# Patient Record
Sex: Male | Born: 1970 | Race: White | Hispanic: No | Marital: Married | Smoking: Never smoker
Health system: Southern US, Community
[De-identification: ages and names within clinical notes are randomized; demographics above are authoritative.]

## PROBLEM LIST (undated history)

## (undated) DIAGNOSIS — N200 Calculus of kidney: Secondary | ICD-10-CM

## (undated) HISTORY — PX: WISDOM TOOTH EXTRACTION: SHX21

---

## 1994-06-19 HISTORY — PX: WISDOM TOOTH EXTRACTION: SHX21

## 2015-05-29 ENCOUNTER — Emergency Department (HOSPITAL_BASED_OUTPATIENT_CLINIC_OR_DEPARTMENT_OTHER): Payer: 59

## 2015-05-29 ENCOUNTER — Encounter (HOSPITAL_BASED_OUTPATIENT_CLINIC_OR_DEPARTMENT_OTHER): Payer: Self-pay | Admitting: *Deleted

## 2015-05-29 ENCOUNTER — Emergency Department (HOSPITAL_BASED_OUTPATIENT_CLINIC_OR_DEPARTMENT_OTHER)
Admission: EM | Admit: 2015-05-29 | Discharge: 2015-05-29 | Disposition: A | Discharge status: Home / Self Care | Payer: 59 | Attending: Emergency Medicine | Admitting: Emergency Medicine

## 2015-05-29 DIAGNOSIS — R7989 Other specified abnormal findings of blood chemistry: Secondary | ICD-10-CM | POA: Diagnosis not present

## 2015-05-29 DIAGNOSIS — R109 Unspecified abdominal pain: Secondary | ICD-10-CM | POA: Diagnosis present

## 2015-05-29 DIAGNOSIS — N201 Calculus of ureter: Secondary | ICD-10-CM | POA: Diagnosis not present

## 2015-05-29 LAB — URINALYSIS, ROUTINE W REFLEX MICROSCOPIC
BILIRUBIN URINE: NEGATIVE
Glucose, UA: NEGATIVE mg/dL
Hgb urine dipstick: NEGATIVE
Ketones, ur: NEGATIVE mg/dL
LEUKOCYTES UA: NEGATIVE
NITRITE: NEGATIVE
PH: 7 (ref 5.0–8.0)
Protein, ur: NEGATIVE mg/dL
SPECIFIC GRAVITY, URINE: 1.025 (ref 1.005–1.030)

## 2015-05-29 LAB — CBC WITH DIFFERENTIAL/PLATELET
BASOS PCT: 0 %
Basophils Absolute: 0 10*3/uL (ref 0.0–0.1)
EOS ABS: 0 10*3/uL (ref 0.0–0.7)
Eosinophils Relative: 0 %
HEMATOCRIT: 43 % (ref 39.0–52.0)
HEMOGLOBIN: 14.7 g/dL (ref 13.0–17.0)
LYMPHS ABS: 1.2 10*3/uL (ref 0.7–4.0)
Lymphocytes Relative: 10 %
MCH: 29.2 pg (ref 26.0–34.0)
MCHC: 34.2 g/dL (ref 30.0–36.0)
MCV: 85.3 fL (ref 78.0–100.0)
Monocytes Absolute: 0.6 10*3/uL (ref 0.1–1.0)
Monocytes Relative: 5 %
NEUTROS ABS: 10 10*3/uL — AB (ref 1.7–7.7)
NEUTROS PCT: 85 %
Platelets: 272 10*3/uL (ref 150–400)
RBC: 5.04 MIL/uL (ref 4.22–5.81)
RDW: 12.8 % (ref 11.5–15.5)
WBC: 11.9 10*3/uL — AB (ref 4.0–10.5)

## 2015-05-29 LAB — BASIC METABOLIC PANEL
ANION GAP: 10 (ref 5–15)
BUN: 22 mg/dL — ABNORMAL HIGH (ref 6–20)
CALCIUM: 9.5 mg/dL (ref 8.9–10.3)
CHLORIDE: 106 mmol/L (ref 101–111)
CO2: 24 mmol/L (ref 22–32)
CREATININE: 1.57 mg/dL — AB (ref 0.61–1.24)
GFR calc non Af Amer: 52 mL/min — ABNORMAL LOW (ref >60)
GFR, EST AFRICAN AMERICAN: 60 mL/min — AB (ref >60)
Glucose, Bld: 112 mg/dL — ABNORMAL HIGH (ref 65–99)
Potassium: 4.4 mmol/L (ref 3.5–5.1)
SODIUM: 140 mmol/L (ref 135–145)

## 2015-05-29 MED ORDER — TAMSULOSIN HCL 0.4 MG PO CAPS: 0.4000 mg | ORAL_CAPSULE | Freq: Every day | ORAL | Status: DC

## 2015-05-29 MED ORDER — MORPHINE SULFATE (PF) 4 MG/ML IV SOLN
8.0000 mg | Freq: Once | INTRAVENOUS | Status: AC
  Administered 2015-05-29: 8 mg via INTRAVENOUS
  Filled 2015-05-29: qty 2

## 2015-05-29 MED ORDER — OXYCODONE-ACETAMINOPHEN 5-325 MG PO TABS: 1.0000 | ORAL_TABLET | ORAL | Status: DC | PRN

## 2015-05-29 MED ORDER — ONDANSETRON 4 MG PO TBDP: ORAL_TABLET | ORAL | Status: DC

## 2015-05-29 MED ORDER — ONDANSETRON HCL 4 MG/2ML IJ SOLN
4.0000 mg | Freq: Once | INTRAMUSCULAR | Status: AC
  Administered 2015-05-29: 4 mg via INTRAVENOUS
  Filled 2015-05-29: qty 2

## 2015-05-29 MED ORDER — SODIUM CHLORIDE 0.9 % IV BOLUS (SEPSIS)
1000.0000 mL | Freq: Once | INTRAVENOUS | Status: AC
  Administered 2015-05-29: 1000 mL via INTRAVENOUS

## 2015-05-29 NOTE — ED Notes (Signed)
Left flank pain since 0400- sudden onset- pain intermittent

## 2015-05-29 NOTE — Discharge Instructions (Signed)
Your Creatinine (a measure of your kidney function) is 1.57. This is elevated but there is no old one to compare to. Please drink plenty of fluids and have it rechecked with your primary doctor or the urologist in 1-2 weeks. If you develop fevers, intractable vomiting, worse pain or pain with urination.

## 2015-05-29 NOTE — ED Provider Notes (Signed)
CSN: [id number]     Arrival date & time 05/29/15  1100 History   First MD Initiated Contact with Patient 05/29/15 1201     Chief Complaint  Patient presents with  . Flank Pain     (Consider location/radiation/quality/duration/timing/severity/associated sxs/prior Treatment) HPI  44 year old male presents with sudden onset of left flank pain at 4 AM. Woke him up out of sleep and was hard to find a comfortable position. Took 2 ibuprofen. Did have multiple episodes of vomiting. Eventually able to go back to sleep and when he woke up this morning he has had continued intermittent pain. The pain now feels like it's more in his left abdomen and seems to radiate into his left scrotum. Denies dysuria or hematuria. No fevers. Has continued to have vomiting and vomited once upon arrival to the ER. No prior history of kidney stones or pain similar to this. Rates his pain currently as a 6/10.  History reviewed. No pertinent past medical history. Past Surgical History  Procedure Laterality Date  . Wisdom tooth extraction     No family history on file. Social History  Substance Use Topics  . Smoking status: Never Smoker   . Smokeless tobacco: Never Used  . Alcohol Use: No    Review of Systems  Constitutional: Negative for fever.  Gastrointestinal: Positive for nausea, vomiting and abdominal pain.  Genitourinary: Positive for flank pain. Negative for dysuria, hematuria and scrotal swelling.  All other systems reviewed and are negative.     Allergies  Review of patient's allergies indicates no known allergies.  Home Medications   Prior to Admission medications   Not on File   BP 146/82 mmHg  Pulse 58  Temp(Src) 98.7 F (37.1 C) (Oral)  Resp 20  Ht 5\' 10"  (1.778 m)  Wt 240 lb (108.863 kg)  BMI 34.44 kg/m2  SpO2 100% Physical Exam  Constitutional: He is oriented to person, place, and time. He appears well-developed and well-nourished.  HENT:  Head: Normocephalic and atraumatic.   Right Ear: External ear normal.  Left Ear: External ear normal.  Nose: Nose normal.  Eyes: Right eye exhibits no discharge. Left eye exhibits no discharge.  Neck: Neck supple.  Cardiovascular: Normal rate, regular rhythm, normal heart sounds and intact distal pulses.   Pulmonary/Chest: Effort normal and breath sounds normal.  Abdominal: Soft. He exhibits no distension. There is no tenderness. There is no CVA tenderness. Hernia confirmed negative in the right inguinal area and confirmed negative in the left inguinal area.  Genitourinary: Testes normal and penis normal. Right testis shows no swelling and no tenderness. Left testis shows no swelling and no tenderness.  Musculoskeletal: He exhibits no edema.  Neurological: He is alert and oriented to person, place, and time.  Skin: Skin is warm and dry.  Nursing note and vitals reviewed.   ED Course  Procedures (including critical care time) Labs Review Labs Reviewed  BASIC METABOLIC PANEL - Abnormal; Notable for the following:    Glucose, Bld 112 (*)    BUN 22 (*)    Creatinine, Ser 1.57 (*)    GFR calc non Af Amer 52 (*)    GFR calc Af Amer 60 (*)    All other components within normal limits  CBC WITH DIFFERENTIAL/PLATELET - Abnormal; Notable for the following:    WBC 11.9 (*)    Neutro Abs 10.0 (*)    All other components within normal limits  URINALYSIS, ROUTINE W REFLEX MICROSCOPIC (NOT AT [facility])  Imaging Review Ct Renal Stone Study  05/29/2015  CLINICAL DATA:  Acute onset left flank pain EXAM: CT ABDOMEN AND PELVIS WITHOUT CONTRAST TECHNIQUE: Multidetector CT imaging of the abdomen and pelvis was performed following the standard protocol without IV contrast. COMPARISON:  None. FINDINGS: The lung bases are unremarkable. Some linear atelectasis or scarring noted left base. Sagittal images of the spine shows mild degenerative changes. Mild disc space flattening at L5-S1 level. Mild anterior spurring upper endplate of L1  vertebral body. Unenhanced liver shows no biliary ductal dilatation. No calcified gallstones are noted within gallbladder. Unenhanced pancreas, spleen and adrenal glands are unremarkable. No aortic aneurysm. There is mild left hydronephrosis and left hydroureter. Small umbilical hernia containing fat without evidence of acute complication. Mild left perinephric stranding.  No nephrolithiasis. In axial image 83 there is 4 mm calcified calculus in left UVJ. Prostate gland and seminal vesicles are unremarkable. Small left inguinal canal hernia containing fat measures 2.5 cm. No evidence of acute complication. No small bowel obstruction. Normal appendix is noted in axial image 57. No pericecal inflammation. Moderate stool noted within cecum. The terminal ileum is unremarkable. No ascites or free air. IMPRESSION: 1. Mild left hydronephrosis and left hydroureter. Mild left perinephric stranding. 2. There is 4 mm calcified calculus in left UVJ. 3. Moderate stool noted within cecum. Normal appendix. No pericecal inflammation. 4. No small bowel obstruction. 5. Small umbilical hernia containing fat. No evidence of acute complication. 6. No calcified calculi are noted within urinary bladder. Electronically Signed   By: Lahoma Crocker M.D.   On: 05/29/2015 13:14   I have personally reviewed and evaluated these images and lab results as part of my medical decision-making.   EKG Interpretation None      MDM   Final diagnoses:  Left ureteral stone  Elevated serum creatinine    Patient with an otherwise uncomplicated left ureteral stone. Patient's GU exam is normal. Pain has been controlled with IV morphine and he has had no vomiting since being given Zofran. Patient does have an elevated creatinine but there is no baseline to compare to. This could represent mild dehydration versus chronic elevation. I have recommended he drink plenty of fluids and follow-up with either a PCP and/or urology and have a recheck in 1-2  weeks. Discussed strict return precautions.    Sherwood Gambler, MD 05/29/15 1435

## 2016-10-27 IMAGING — CT CT RENAL STONE PROTOCOL
2 of 4 series · 16 of 46 positions shown, 18 images · non-contrast
Comparison: None.

CLINICAL DATA: Acute onset left flank pain

EXAM:
CT ABDOMEN AND PELVIS WITHOUT CONTRAST
TECHNIQUE: Multidetector CT imaging of the abdomen and pelvis was performed
following the standard protocol without IV contrast.

[Series 2: renal stone > 200 lbs 5.0 b31f · axial · 0.82mm/px · z∈[-481,-1]mm · 13 of 106 slices shown, 15 images]
[im 5/106  soft-tissue]
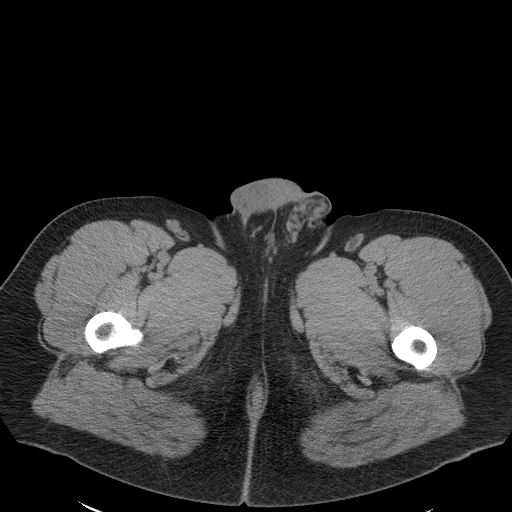
[im 5/106  bone]
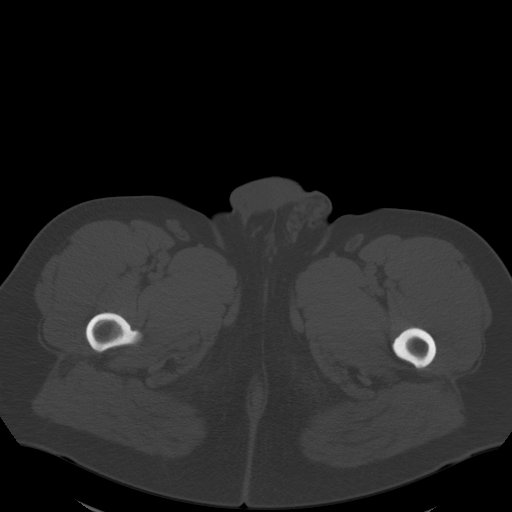
[im 14/106  soft-tissue]
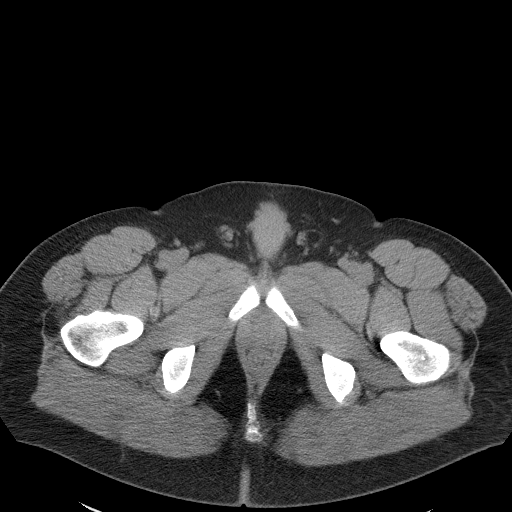
[im 22/106  soft-tissue]
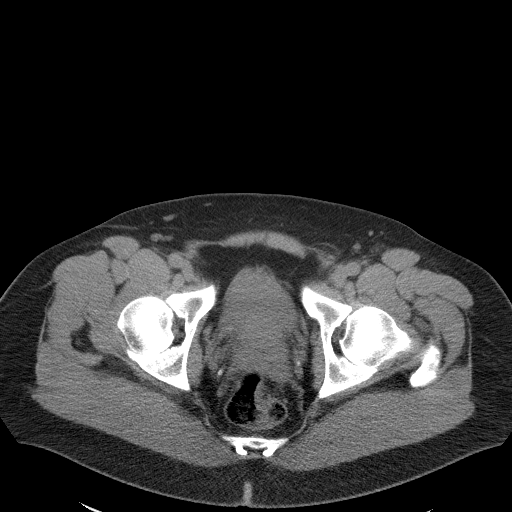
[im 31/106  soft-tissue]
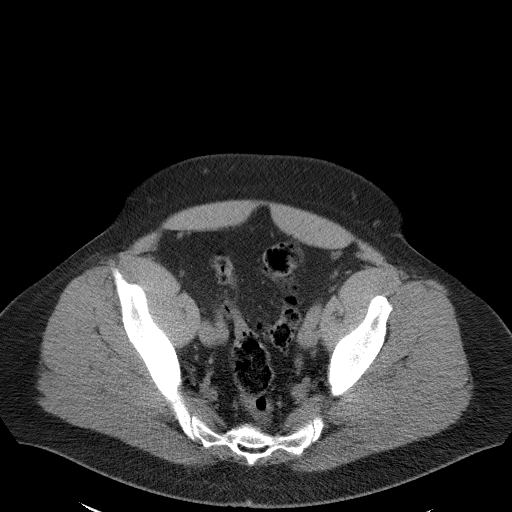
[im 36/106  soft-tissue]
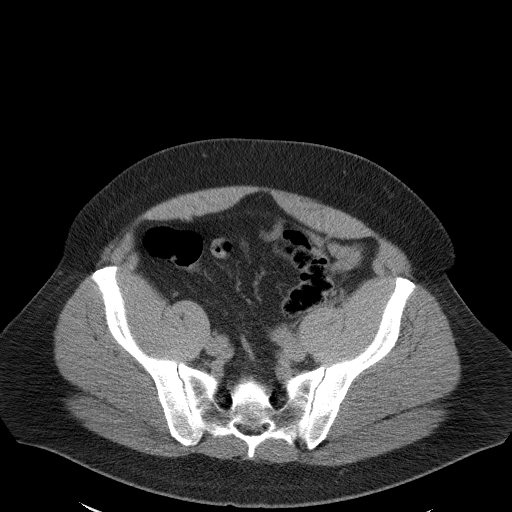
[im 44/106  soft-tissue]
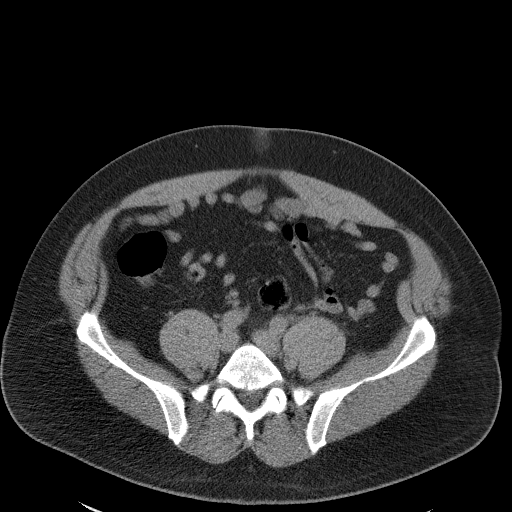
[im 53/106  soft-tissue]
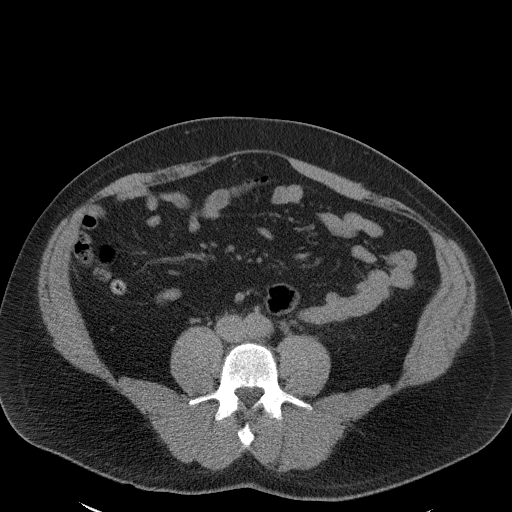
[im 62/106  soft-tissue]
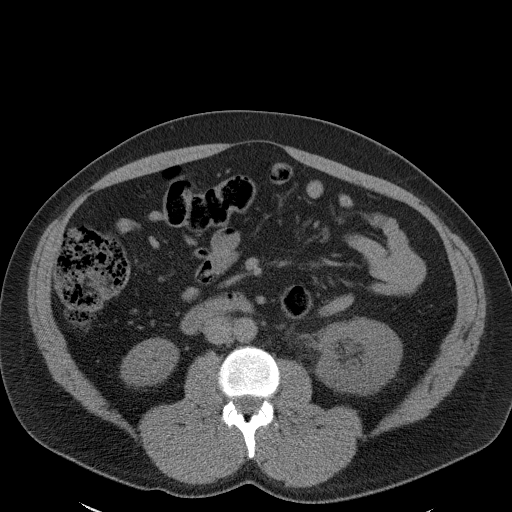
[im 71/106  soft-tissue]
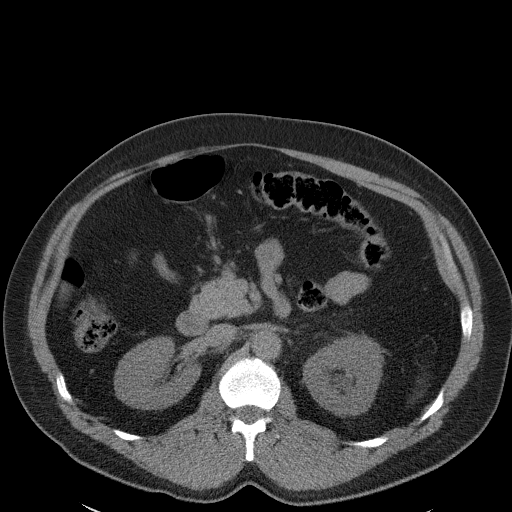
[im 71/106  bone]
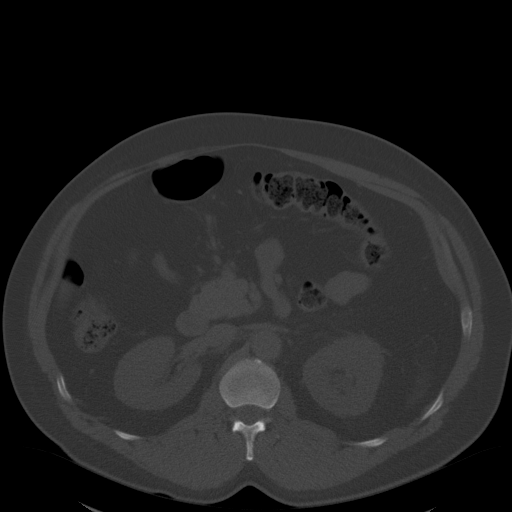
[im 75/106  soft-tissue]
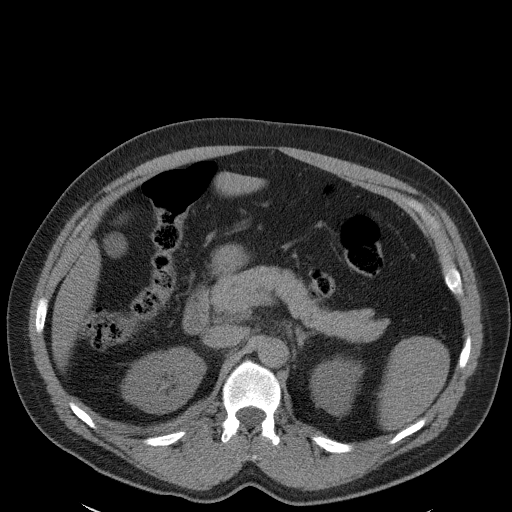
[im 84/106  soft-tissue]
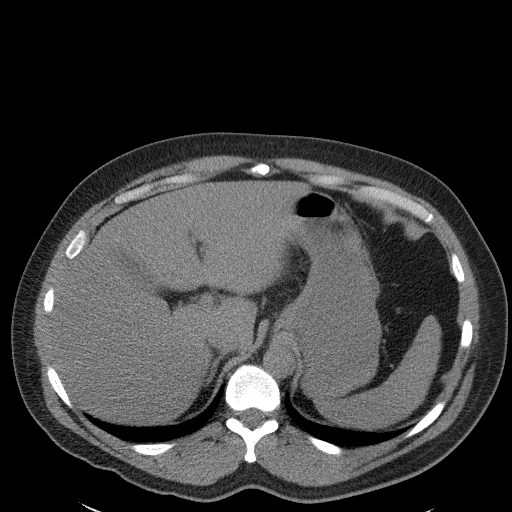
[im 92/106  soft-tissue]
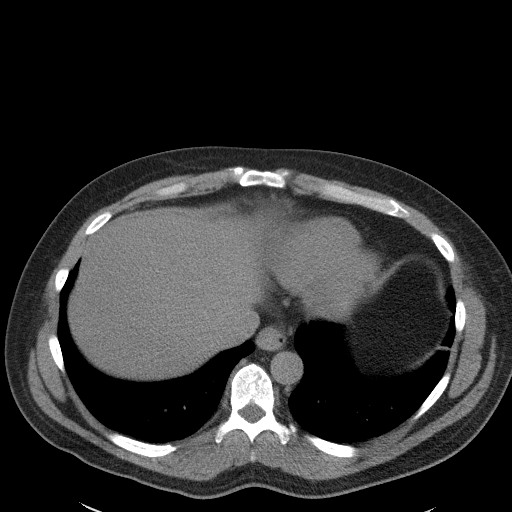
[im 101/106  soft-tissue]
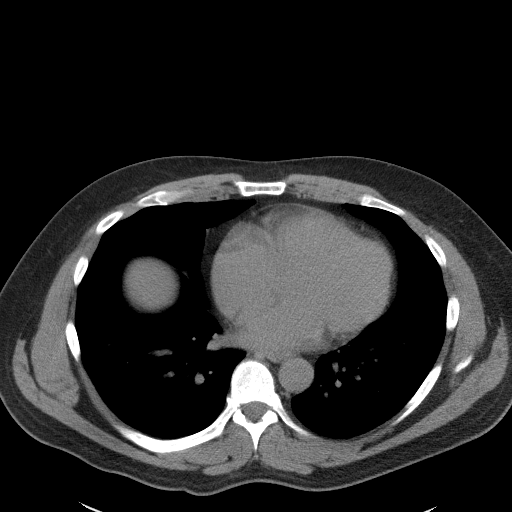

[Series 5: renal stone 3.0 coronal · coronal · 0.94mm/px · 3 of 97 slices shown]
[im 33/97  soft-tissue]
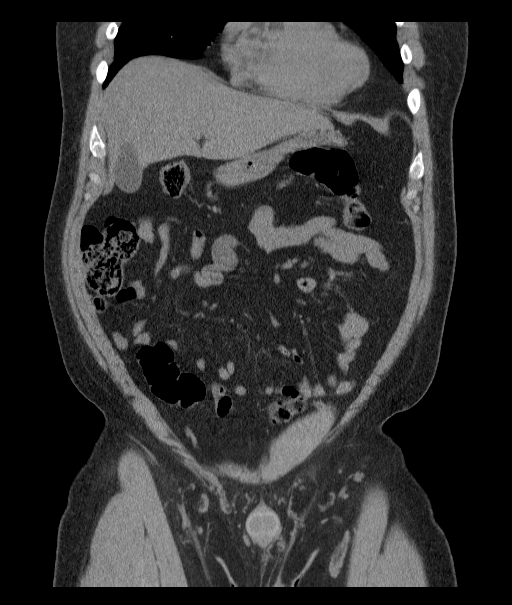
[im 43/97  soft-tissue]
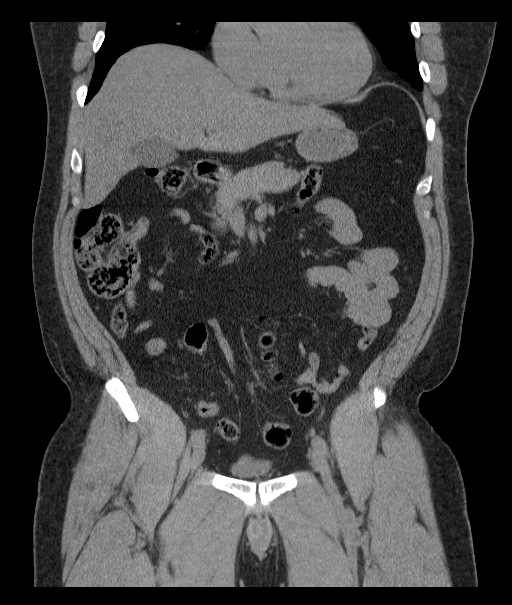
[im 54/97  soft-tissue]
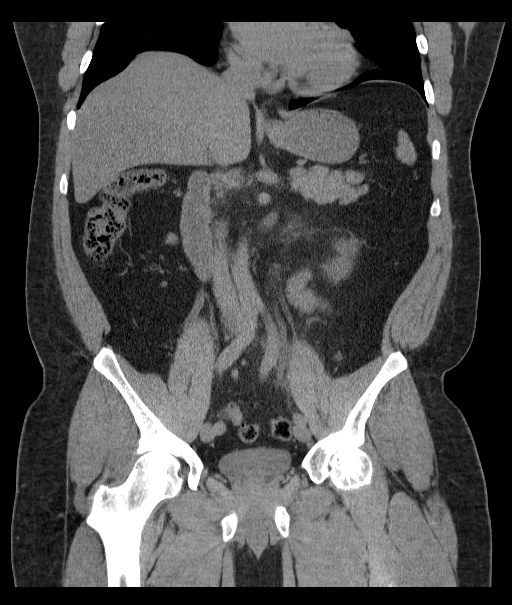

[16 of 46 positions shown; findings below may reference images not displayed]

FINDINGS: The lung bases are unremarkable. Some linear atelectasis or scarring
noted left base. Sagittal images of the spine shows mild
degenerative changes. Mild disc space flattening at L5-S1 level.
Mild anterior spurring upper endplate of L1 vertebral body.

Unenhanced liver shows no biliary ductal dilatation. No calcified
gallstones are noted within gallbladder. Unenhanced pancreas, spleen
and adrenal glands are unremarkable. No aortic aneurysm.

There is mild left hydronephrosis and left hydroureter. Small
umbilical hernia containing fat without evidence of acute
complication.

Mild left perinephric stranding.  No nephrolithiasis.

In axial image 83 there is 4 mm calcified calculus in left UVJ.
Prostate gland and seminal vesicles are unremarkable. Small left
inguinal canal hernia containing fat measures 2.5 cm. No evidence of
acute complication.

No small bowel obstruction. Normal appendix is noted in axial image
57. No pericecal inflammation. Moderate stool noted within cecum.
The terminal ileum is unremarkable. No ascites or free air.
IMPRESSION: 1. Mild left hydronephrosis and left hydroureter. Mild left
perinephric stranding.
2. There is 4 mm calcified calculus in left UVJ.
3. Moderate stool noted within cecum. Normal appendix. No pericecal
inflammation.
4. No small bowel obstruction.
5. Small umbilical hernia containing fat. No evidence of acute
complication.
6. No calcified calculi are noted within urinary bladder.

## 2017-05-29 DIAGNOSIS — Z23 Encounter for immunization: Secondary | ICD-10-CM | POA: Diagnosis not present

## 2017-05-29 DIAGNOSIS — Z0000 Encounter for general adult medical examination without abnormal findings: Secondary | ICD-10-CM | POA: Diagnosis not present

## 2017-05-29 DIAGNOSIS — Z6837 Body mass index (BMI) 37.0-37.9, adult: Secondary | ICD-10-CM | POA: Diagnosis not present

## 2017-05-29 DIAGNOSIS — Z125 Encounter for screening for malignant neoplasm of prostate: Secondary | ICD-10-CM | POA: Diagnosis not present

## 2018-05-24 DIAGNOSIS — Z0000 Encounter for general adult medical examination without abnormal findings: Secondary | ICD-10-CM | POA: Diagnosis not present

## 2018-05-24 DIAGNOSIS — Z125 Encounter for screening for malignant neoplasm of prostate: Secondary | ICD-10-CM | POA: Diagnosis not present

## 2018-05-31 DIAGNOSIS — E786 Lipoprotein deficiency: Secondary | ICD-10-CM | POA: Diagnosis not present

## 2018-05-31 DIAGNOSIS — Z0000 Encounter for general adult medical examination without abnormal findings: Secondary | ICD-10-CM | POA: Diagnosis not present

## 2018-05-31 DIAGNOSIS — Z1389 Encounter for screening for other disorder: Secondary | ICD-10-CM | POA: Diagnosis not present

## 2018-05-31 DIAGNOSIS — Z6837 Body mass index (BMI) 37.0-37.9, adult: Secondary | ICD-10-CM | POA: Diagnosis not present

## 2018-06-03 DIAGNOSIS — Z1212 Encounter for screening for malignant neoplasm of rectum: Secondary | ICD-10-CM | POA: Diagnosis not present

## 2018-07-12 DIAGNOSIS — K048 Radicular cyst: Secondary | ICD-10-CM | POA: Diagnosis not present

## 2019-05-29 DIAGNOSIS — Z125 Encounter for screening for malignant neoplasm of prostate: Secondary | ICD-10-CM | POA: Diagnosis not present

## 2019-05-29 DIAGNOSIS — E786 Lipoprotein deficiency: Secondary | ICD-10-CM | POA: Diagnosis not present

## 2020-07-27 ENCOUNTER — Ambulatory Visit (AMBULATORY_SURGERY_CENTER): Payer: 59

## 2020-07-27 VITALS — Ht 70.0 in | Wt 250.0 lb

## 2020-07-27 DIAGNOSIS — Z1211 Encounter for screening for malignant neoplasm of colon: Secondary | ICD-10-CM

## 2020-07-27 MED ORDER — SUTAB 1479-225-188 MG PO TABS: 1.0000 | ORAL_TABLET | ORAL | 0 refills | Status: DC

## 2020-07-27 NOTE — Progress Notes (Signed)
Pre visit completed via phone;  Patient verified name, DOB, and address;  No egg or soy allergy known to patient  No issues with past sedation with any surgeries or procedures No intubation problems in the past  No FH of Malignant Hyperthermia No diet pills per patient No home 02 use per patient  No blood thinners per patient  Pt denies issues with constipation  No A fib or A flutter  COVID 19 guidelines implemented in PV today with Pt and RN  COVID screening scheduled on 08/06/2020 at 12:00 pm; patient is aware of appt date/time;  Coupon given to pt in PV today , Code to Pharmacy and  NO PA's for preps discussed with pt in PV today  Due to the COVID-19 pandemic we are asking patients to follow certain guidelines.  Pt aware of COVID protocols and [facility] guidelines  Patient understands the cost associated with the sutab prep and has requested that a coupon be sent with his instructions in the mail- coupon placed in envelop for patient

## 2020-08-11 ENCOUNTER — Ambulatory Visit (AMBULATORY_SURGERY_CENTER): Payer: 59 | Admitting: Gastroenterology

## 2020-08-11 ENCOUNTER — Encounter: Payer: Self-pay | Admitting: Gastroenterology

## 2020-08-11 ENCOUNTER — Other Ambulatory Visit: Payer: Self-pay

## 2020-08-11 VITALS — BP 116/69 | HR 61 | Temp 97.3°F | Resp 14 | Ht 70.0 in | Wt 250.0 lb

## 2020-08-11 DIAGNOSIS — K635 Polyp of colon: Secondary | ICD-10-CM

## 2020-08-11 DIAGNOSIS — Z1211 Encounter for screening for malignant neoplasm of colon: Secondary | ICD-10-CM

## 2020-08-11 DIAGNOSIS — D122 Benign neoplasm of ascending colon: Secondary | ICD-10-CM

## 2020-08-11 DIAGNOSIS — D123 Benign neoplasm of transverse colon: Secondary | ICD-10-CM

## 2020-08-11 MED ORDER — SODIUM CHLORIDE 0.9 % IV SOLN: 500.0000 mL | Freq: Once | INTRAVENOUS | Status: DC

## 2020-08-11 NOTE — Progress Notes (Signed)
Called to room to assist during endoscopic procedure.  Patient ID and intended procedure confirmed with present staff. Received instructions for my participation in the procedure from the performing physician.  

## 2020-08-11 NOTE — Progress Notes (Signed)
Report given to PACU, vss 

## 2020-08-11 NOTE — Progress Notes (Signed)
Pt's states no medical or surgical changes since previsit or office visit. VS by HC. 

## 2020-08-11 NOTE — Op Note (Signed)
[facility] Patient Name: Caleb Cannon Procedure Date: 08/11/2020 12:32 PM MRN: [id number] Endoscopist: Thornton Park MD, MD Age: 50 Referring MD:  Date of Birth: 01/13/1971 Gender: Male Account #: [id number] Procedure:                Colonoscopy Indications:              Screening for colorectal malignant neoplasm, This                            is the patient's first colonoscopy                           No known family history of colon cancer or polyps Medicines:                Monitored Anesthesia Care Procedure:                Pre-Anesthesia Assessment:                           - Prior to the procedure, a History and Physical                            was performed, and patient medications and                            allergies were reviewed. The patient's tolerance of                            previous anesthesia was also reviewed. The risks                            and benefits of the procedure and the sedation                            options and risks were discussed with the patient.                            All questions were answered, and informed consent                            was obtained. Prior Anticoagulants: The patient has                            taken no previous anticoagulant or antiplatelet                            agents. ASA Grade Assessment: I - A normal, healthy                            patient. After reviewing the risks and benefits,                            the patient was deemed in satisfactory condition to  undergo the procedure.                           After obtaining informed consent, the colonoscope                            was passed under direct vision. Throughout the                            procedure, the patient's blood pressure, pulse, and                            oxygen saturations were monitored continuously. The                            Olympus CF-HQ190 [id number])  Colonoscope was                            introduced through the anus and advanced to the the                            cecum, identified by appendiceal orifice and                            ileocecal valve. A second forward view of the right                            colon was performed. The colonoscopy was performed                            without difficulty. The patient tolerated the                            procedure well. The quality of the bowel                            preparation was excellent. The ileocecal valve,                            appendiceal orifice, and rectum were photographed. Scope In: 1:28:58 PM Scope Out: 1:46:29 PM Scope Withdrawal Time: 0 hours 14 minutes 23 seconds  Total Procedure Duration: 0 hours 17 minutes 31 seconds  Findings:                 The perianal and digital rectal examinations were                            normal.                           Multiple small and large-mouthed diverticula were                            found in the sigmoid colon and distal descending  colon.                           Three sessile polyps were found in the hepatic                            flexure and ascending colon. The polyps were 1 to 2                            mm in size. These polyps were removed with a cold                            snare. Resection and retrieval were complete.                            Estimated blood loss was minimal.                           The exam was otherwise without abnormality on                            direct and retroflexion views. Complications:            No immediate complications. Estimated blood loss:                            Minimal. Estimated Blood Loss:     Estimated blood loss was minimal. Impression:               - Diverticulosis in the sigmoid colon and in the                            distal descending colon.                           - Three 1 to 2 mm polyps at the  hepatic flexure and                            in the ascending colon.                           - The examination was otherwise normal on direct                            and retroflexion views.                           - No specimens collected. Recommendation:           - Patient has a contact number available for                            emergencies. The signs and symptoms of potential                            delayed complications were discussed with the  patient. Return to normal activities tomorrow.                            Written discharge instructions were provided to the                            patient.                           - High fiber diet.                           - Continue present medications.                           - Await pathology results.                           - Repeat colonoscopy date to be determined after                            pending pathology results are reviewed for                            surveillance.                           - Emerging evidence supports eating a diet of                            fruits, vegetables, grains, calcium, and yogurt                            while reducing red meat and alcohol may reduce the                            risk of colon cancer.                           - Thank you for allowing me to be involved in your                            colon cancer prevention. Thornton Park MD, MD 08/11/2020 1:55:38 PM This report has been signed electronically.

## 2020-08-11 NOTE — Patient Instructions (Signed)
YOU HAD AN ENDOSCOPIC PROCEDURE TODAY AT [facility]:   Refer to the procedure report that was given to you for any specific questions about what was found during the examination.  If the procedure report does not answer your questions, please call your gastroenterologist to clarify.  If you requested that your care partner not be given the details of your procedure findings, then the procedure report has been included in a sealed envelope for you to review at your convenience later.  YOU SHOULD EXPECT: Some feelings of bloating in the abdomen. Passage of more gas than usual.  Walking can help get rid of the air that was put into your GI tract during the procedure and reduce the bloating. If you had a lower endoscopy (such as a colonoscopy or flexible sigmoidoscopy) you may notice spotting of blood in your stool or on the toilet paper. If you underwent a bowel prep for your procedure, you may not have a normal bowel movement for a few days.  Please Note:  You might notice some irritation and congestion in your nose or some drainage.  This is from the oxygen used during your procedure.  There is no need for concern and it should clear up in a day or so.  SYMPTOMS TO REPORT IMMEDIATELY:   Following lower endoscopy (colonoscopy or flexible sigmoidoscopy):  Excessive amounts of blood in the stool  Significant tenderness or worsening of abdominal pains  Swelling of the abdomen that is new, acute  Fever of 100F or higher   For urgent or emergent issues, a gastroenterologist can be reached at any hour by calling [phone]. Do not use MyChart messaging for urgent concerns.    DIET:  We do recommend a small meal at first, but then you may proceed to your regular diet.  Drink plenty of fluids but you should avoid alcoholic beverages for 24 hours. Follow High Fiber Diet.  MEDICATIONS: Continue present medications.  Please see handouts given to you by your recovery nurse.  Thank  you for allowing [place] to provide for your healthcare needs today.  ACTIVITY:  You should plan to take it easy for the rest of today and you should NOT DRIVE or use heavy machinery until tomorrow (because of the sedation medicines used during the test).    FOLLOW UP: Our staff will call the number listed on your records 48-72 hours following your procedure to check on you and address any questions or concerns that you may have regarding the information given to you following your procedure. If we do not reach you, we will leave a message.  We will attempt to reach you two times.  During this call, we will ask if you have developed any symptoms of COVID 19. If you develop any symptoms (ie: fever, flu-like symptoms, shortness of breath, cough etc.) before then, please call [phone].  If you test positive for Covid 19 in the 2 weeks post procedure, please call and report this information to [place].    If any biopsies were taken you will be contacted by phone or by letter within the next 1-3 weeks.  Please call us at [phone] if you have not heard about the biopsies in 3 weeks.    SIGNATURES/CONFIDENTIALITY: You and/or your care partner have signed paperwork which will be entered into your electronic medical record.  These signatures attest to the fact that that the information above on your After Visit Summary has been reviewed and is understood.  Full responsibility of the confidentiality of this discharge information lies with you and/or your care-partner.

## 2020-08-13 ENCOUNTER — Telehealth: Payer: Self-pay

## 2020-08-13 NOTE — Telephone Encounter (Signed)
  Follow up Call-  Call back number 08/11/2020  Post procedure Call Back phone  # [phone]  Permission to leave phone message Yes  Some recent data might be hidden     Patient questions:  Do you have a fever, pain , or abdominal swelling? No. Pain Score  0 *  Have you tolerated food without any problems? Yes.    Have you been able to return to your normal activities? Yes.    Do you have any questions about your discharge instructions: Diet   No. Medications  No. Follow up visit  No.  Do you have questions or concerns about your Care? No.  Actions: * If pain score is 4 or above: No action needed, pain <4.  1. Have you developed a fever since your procedure? no  2.   Have you had an respiratory symptoms (SOB or cough) since your procedure? no  3.   Have you tested positive for COVID 19 since your procedure no  4.   Have you had any family members/close contacts diagnosed with the COVID 19 since your procedure?  no   If yes to any of these questions please route to Joylene John, RN and Joella Prince, RN

## 2020-08-26 ENCOUNTER — Encounter: Payer: Self-pay | Admitting: Gastroenterology

## 2020-11-12 ENCOUNTER — Other Ambulatory Visit: Payer: Self-pay

## 2020-11-12 ENCOUNTER — Emergency Department (HOSPITAL_BASED_OUTPATIENT_CLINIC_OR_DEPARTMENT_OTHER): Payer: 59

## 2020-11-12 ENCOUNTER — Inpatient Hospital Stay (HOSPITAL_BASED_OUTPATIENT_CLINIC_OR_DEPARTMENT_OTHER)
Admission: EM | Admit: 2020-11-12 | Discharge: 2020-11-13 | DRG: 178 | Disposition: A | Discharge status: Home / Self Care | Payer: 59 | Attending: Internal Medicine | Admitting: Internal Medicine

## 2020-11-12 ENCOUNTER — Encounter (HOSPITAL_BASED_OUTPATIENT_CLINIC_OR_DEPARTMENT_OTHER): Payer: Self-pay | Admitting: *Deleted

## 2020-11-12 DIAGNOSIS — R778 Other specified abnormalities of plasma proteins: Secondary | ICD-10-CM | POA: Diagnosis present

## 2020-11-12 DIAGNOSIS — I4891 Unspecified atrial fibrillation: Secondary | ICD-10-CM | POA: Diagnosis present

## 2020-11-12 DIAGNOSIS — J310 Chronic rhinitis: Secondary | ICD-10-CM | POA: Diagnosis present

## 2020-11-12 DIAGNOSIS — R7989 Other specified abnormal findings of blood chemistry: Secondary | ICD-10-CM | POA: Diagnosis present

## 2020-11-12 DIAGNOSIS — Z87442 Personal history of urinary calculi: Secondary | ICD-10-CM | POA: Diagnosis not present

## 2020-11-12 DIAGNOSIS — I480 Paroxysmal atrial fibrillation: Secondary | ICD-10-CM | POA: Diagnosis present

## 2020-11-12 DIAGNOSIS — U071 COVID-19: Principal | ICD-10-CM | POA: Diagnosis present

## 2020-11-12 DIAGNOSIS — I4892 Unspecified atrial flutter: Secondary | ICD-10-CM | POA: Diagnosis present

## 2020-11-12 DIAGNOSIS — R002 Palpitations: Secondary | ICD-10-CM | POA: Diagnosis not present

## 2020-11-12 HISTORY — DX: Unspecified atrial fibrillation: I48.91

## 2020-11-12 HISTORY — DX: Calculus of kidney: N20.0

## 2020-11-12 LAB — RESP PANEL BY RT-PCR (FLU A&B, COVID) ARPGX2
Influenza A by PCR: NEGATIVE
Influenza B by PCR: NEGATIVE
SARS Coronavirus 2 by RT PCR: POSITIVE — AB

## 2020-11-12 LAB — HEPATIC FUNCTION PANEL
ALT: 36 U/L (ref 0–44)
AST: 30 U/L (ref 15–41)
Albumin: 4 g/dL (ref 3.5–5.0)
Alkaline Phosphatase: 50 U/L (ref 38–126)
Bilirubin, Direct: 0.1 mg/dL (ref 0.0–0.2)
Indirect Bilirubin: 0.6 mg/dL (ref 0.3–0.9)
Total Bilirubin: 0.7 mg/dL (ref 0.3–1.2)
Total Protein: 6.2 g/dL — ABNORMAL LOW (ref 6.5–8.1)

## 2020-11-12 LAB — CBC
HCT: 45.2 % (ref 39.0–52.0)
Hemoglobin: 15.4 g/dL (ref 13.0–17.0)
MCH: 29.3 pg (ref 26.0–34.0)
MCHC: 34.1 g/dL (ref 30.0–36.0)
MCV: 86.1 fL (ref 80.0–100.0)
Platelets: 223 10*3/uL (ref 150–400)
RBC: 5.25 MIL/uL (ref 4.22–5.81)
RDW: 12.4 % (ref 11.5–15.5)
WBC: 4.8 10*3/uL (ref 4.0–10.5)
nRBC: 0 % (ref 0.0–0.2)

## 2020-11-12 LAB — BASIC METABOLIC PANEL
Anion gap: 9 (ref 5–15)
BUN: 14 mg/dL (ref 6–20)
CO2: 26 mmol/L (ref 22–32)
Calcium: 9.3 mg/dL (ref 8.9–10.3)
Chloride: 107 mmol/L (ref 98–111)
Creatinine, Ser: 1.02 mg/dL (ref 0.61–1.24)
GFR, Estimated: >60 mL/min (ref >60)
Glucose, Bld: 101 mg/dL — ABNORMAL HIGH (ref 70–99)
Potassium: 4.1 mmol/L (ref 3.5–5.1)
Sodium: 142 mmol/L (ref 135–145)

## 2020-11-12 LAB — TROPONIN I (HIGH SENSITIVITY): Troponin I (High Sensitivity): 27 ng/L — ABNORMAL HIGH (ref <18)

## 2020-11-12 MED ORDER — ENOXAPARIN SODIUM 40 MG/0.4ML IJ SOSY
40.0000 mg | PREFILLED_SYRINGE | INTRAMUSCULAR | Status: DC
  Administered 2020-11-13: 40 mg via SUBCUTANEOUS
  Filled 2020-11-12: qty 0.4

## 2020-11-12 MED ORDER — ACETAMINOPHEN 325 MG PO TABS: 650.0000 mg | ORAL_TABLET | Freq: Four times a day (QID) | ORAL | Status: DC | PRN

## 2020-11-12 MED ORDER — DILTIAZEM HCL-DEXTROSE 125-5 MG/125ML-% IV SOLN (PREMIX)
5.0000 mg/h | INTRAVENOUS | Status: DC
  Administered 2020-11-12 – 2020-11-13 (×2): 5 mg/h via INTRAVENOUS
  Filled 2020-11-12 (×2): qty 125

## 2020-11-12 MED ORDER — SODIUM CHLORIDE 0.9 % IV BOLUS: 1000.0000 mL | Freq: Once | INTRAVENOUS | Status: DC

## 2020-11-12 MED ORDER — SODIUM CHLORIDE 0.9 % IV SOLN: INTRAVENOUS | Status: DC

## 2020-11-12 MED ORDER — ALBUTEROL SULFATE HFA 108 (90 BASE) MCG/ACT IN AERS
1.0000 | INHALATION_SPRAY | RESPIRATORY_TRACT | Status: DC | PRN
  Filled 2020-11-12: qty 6.7

## 2020-11-12 MED ORDER — ACETAMINOPHEN 650 MG RE SUPP: 650.0000 mg | Freq: Four times a day (QID) | RECTAL | Status: DC | PRN

## 2020-11-12 MED ORDER — DILTIAZEM LOAD VIA INFUSION
10.0000 mg | Freq: Once | INTRAVENOUS | Status: AC
  Administered 2020-11-12: 10 mg via INTRAVENOUS
  Filled 2020-11-12: qty 10

## 2020-11-12 NOTE — Progress Notes (Signed)
Patient: Caleb Cannon, Caleb Cannon (DOB 20-Jun-1970) MRN: [id number]  I discussed the following case with Dr. Rogene Houston (emergency department physician at [facility]), who contacted the call-center requesting transfer of the above patient from [facility] ED to [facility] for further work-up and management of new diagnosis of atrial flutter with RVR.   [name] is a 50 year old male who denies any prior known underlying medical history, who presented to [facility] today complaining of 1 day of palpitations, and found to be in atrial flutter with initial heart rates in the 180s in the context of no prior known history of atrial flutter or atrial fibrillation.  This was preceded by 5 days of rhinitis, rhinorrhea, subjective fever over which time he reports starting on a nonspecified decongestant before ensuing development of palpitations over the last 1 day, prompting him to initially present to [facility] earlier today where his heart rates were found to be in the 180s, prompting PCP to instruct patient to present to local ED for further evaluation of this.   Initial systolic blood pressures at [facility] were noted to be in the low 100s.  Patient initially received diltiazem 10 mg IV x1, with transient improvement in heart rate into the 130s, with ensuing initiation of diltiazem drip upon which patient's heart rate has reportedly been consistently under 100, with interval increase in systolic blood pressure into the 120s, without any episodes of hypotension.  O2 sats reported to be in the mid to high 90s on room air.  Chest x-ray reportedly no evidence of acute cardiopulmonary process.  COVID-19 testing performed at [facility] ED found to be positive.   Subsequently, I accepted this patient for transfer to a stepdown bed at [facility] for further work-up and management of new diagnosis of atrial flutter with RVR in setting of symptomatic COVID-19 infection.  Remains on diltiazem drip at this time, with  heart rates in the 90s to low 825O, with systolic blood pressures continuing to be in the 120s mmHg.     Babs Bertin, DO Hospitalist

## 2020-11-12 NOTE — ED Notes (Signed)
Continues to deny chest pain

## 2020-11-12 NOTE — ED Notes (Signed)
[facility] has arrived for patient transport.

## 2020-11-12 NOTE — ED Notes (Signed)
Pt denies chest pain, he does "feel" strange feeling in his chest, no n/v hx, low grade fever the past few days. Alert and oriented

## 2020-11-12 NOTE — H&P (Signed)
History and Physical    PLEASE NOTE THAT DRAGON DICTATION SOFTWARE WAS USED IN THE CONSTRUCTION OF THIS NOTE.   Caleb Cannon GBM:[id number] DOB: [birth year] DOA: 11/12/2020  PCP: Haywood Pao, MD Patient coming from: home   I have personally briefly reviewed patient's old medical records in [place]  Chief Complaint: Palpitations  HPI: Caleb Cannon is a 50 y.o. male with medical history significant for nephrolithiasis in December 2016 who is admitted to [facility] on 11/12/2020 by way of transfer from [place] with new diagnosis of atrial flutter with RVR after presenting from home to the latter facility complaining of palpitations.   The patient reports 1 day of palpitations in the absence of any associated chest pain, shortness of breath, diaphoresis, dizziness, presyncope, or syncope.  He notes that this was preceded by 4 to 5 days of subjective fever and chills in the absence of full body rigors or generalized myalgias.  He also notes development of progressive rhinitis/rhinorrhea over that timeframe, without associated sore throat.  Not associated with any recent headache, neck stiffness, wheezing, cough, abdominal pain, diarrhea, or rash.  No recent traveling or known COVID-19 exposures.  He also denies any recent dysuria, gross hematuria, or change in urinary urgency/frequency.   Denies any recent orthopnea, PND, or development of edema in the lower extremities.  He also denies any recent calf tenderness or new lower extremity erythema.  He conveys that he is a lifelong non-smoker, denies any known chronic underlying pulmonary pathology.   Denies any known prior history of atrial flutter or atrial fibrillation, and denies any history of CHF or CAD.  Has not previously undergone echocardiogram, per the patient's report.  Not on any blood thinning agents as an outpatient, including no aspirin.  Denies any history of gastrointestinal bleed.  Denies any regular  consumption of alcohol, or any recent consumption thereof.  No recreational drug use.  In the setting of 1 day of ongoing palpitations, the patient presented to his PCP at the [facility] earlier today at which time his heart rate was found to be in the 170s to 180s, prompting PCP to instruct the patient to present to the local emergency department for further evaluation of this.  Corresponding with the patient subsequently presented to [place] ED early today     [place] ED Course:  Vital signs in the ED were notable for the following: Temperature max 98.4, initial heart rates 180s, with ensuing transient improvement into the 130s following diltiazem to milligrams IV x1, with most recent heart rates in the 90s to low 100s following initiation of diltiazem drip, as further detailed below.  Presenting systolic blood pressures found to be in the low 100s, with most recent blood pressure noted to be 129/91; respiratory rate 15-22; oxygen saturation 97 to 100% on room air.   Labs were notable for the following: CMP was notable for sodium 142, potassium 4.1, bicarbonate 26, creatinine 1.02, and liver enzymes were found to be within normal limits, including AST 30 and ALT 36.  High-sensitivity troponin I x1 found to be 27.  CBC notable for white cell count 4800, hemoglobin 15.  Nasopharyngeal COVID-19 PCR performed at [facility] was found to be positive, will influenza PCR noted to be negative.  Initial EKG suggestive of atrial flutter, with HR 170's, with repeat EKG following initiation of diltiazem drip noted to suggest atrial flutter with ventricular rate 126, intraventricular conduction delay, without evidence of  T wave or ST changes, including no evidence of ST elevation.  Chest x-ray shows mildly enlarged heart, but otherwise demonstrates no evidence of acute cardiopulmonary process, including no evidence of infiltrate, edema, effusion, or pneumothorax.  While in  the ED, the following were administered: Diltiazem 10 mg IV x1, followed by initiation of diltiazem drip.  He also received a 1 L normal saline bolus followed by initiation of maintenance normal saline at 100 cc/h.  Patient was subsequently accepted for transfer to [facility] for further evaluation and management of new diagnosis of atrial flutter with RVR in the setting of symptomatic COVID-19 infection.      Review of Systems: As per HPI otherwise 10 point review of systems negative.   History reviewed. No pertinent past medical history.  Past Surgical History:  Procedure Laterality Date  . [place] EXTRACTION  1996    Social History:  reports that he has never smoked. He has never used smokeless tobacco. He reports that he does not drink alcohol and does not use drugs.   No Known Allergies  Family History  Problem Relation Age of Onset  . Colon polyps Neg Hx   . Colon cancer Neg Hx   . Esophageal cancer Neg Hx   . Rectal cancer Neg Hx   . Stomach cancer Neg Hx     Family history reviewed and not pertinent    Prior to Admission medications   Not on File  Patient reports that he does not take any prescription or over-the-counter medications as an outpatient, either scheduled or on a as needed basis.   Objective    Physical Exam: Vitals:   11/12/20 1745 11/12/20 1900 11/12/20 2105 11/12/20 2112  BP: 121/88 (!) 126/91  120/84  Pulse: 86 85  (!) 110  Resp: '16 15  20  ' Temp:    98.3 F (36.8 C)  TempSrc:    Oral  SpO2: 100% 98%  97%  Weight:   112.4 kg   Height:   '5\' 10"'  (1.778 m)     General: appears to be stated age; alert, oriented Skin: warm, dry, no rash Head:  AT/[place] Mouth:  Oral mucosa membranes appear moist, normal dentition Neck: supple; trachea midline Heart:  RRR; did not appreciate any M/R/G Lungs: CTAB, did not appreciate any wheezes, rales, or rhonchi Abdomen: + BS; soft, ND, NT Vascular: 2+ pedal pulses b/l; 2+ radial pulses  b/l Extremities: no peripheral edema, no muscle wasting Neuro: strength and sensation intact in upper and lower extremities b/l   Labs on Admission: I have personally reviewed following labs and imaging studies  CBC: Recent Labs  Lab 11/12/20 1533  WBC 4.8  HGB 15.4  HCT 45.2  MCV 86.1  PLT 161   Basic Metabolic Panel: Recent Labs  Lab 11/12/20 1533  NA 142  K 4.1  CL 107  CO2 26  GLUCOSE 101*  BUN 14  CREATININE 1.02  CALCIUM 9.3   GFR: Estimated Creatinine Clearance: 108.8 mL/min (by C-G formula based on SCr of 1.02 mg/dL). Liver Function Tests: Recent Labs  Lab 11/12/20 1621  AST 30  ALT 36  ALKPHOS 50  BILITOT 0.7  PROT 6.2*  ALBUMIN 4.0   No results for input(s): LIPASE, AMYLASE in the last 168 hours. No results for input(s): AMMONIA in the last 168 hours. Coagulation Profile: No results for input(s): INR, PROTIME in the last 168 hours. Cardiac Enzymes: No results for input(s): CKTOTAL, CKMB, CKMBINDEX, TROPONINI in the last  168 hours. BNP (last 3 results) No results for input(s): PROBNP in the last 8760 hours. HbA1C: No results for input(s): HGBA1C in the last 72 hours. CBG: No results for input(s): GLUCAP in the last 168 hours. Lipid Profile: No results for input(s): CHOL, HDL, LDLCALC, TRIG, CHOLHDL, LDLDIRECT in the last 72 hours. Thyroid Function Tests: No results for input(s): TSH, T4TOTAL, FREET4, T3FREE, THYROIDAB in the last 72 hours. Anemia Panel: No results for input(s): VITAMINB12, FOLATE, FERRITIN, TIBC, IRON, RETICCTPCT in the last 72 hours. Urine analysis:    Component Value Date/Time   COLORURINE YELLOW 05/29/2015 1150   APPEARANCEUR CLEAR 05/29/2015 1150   LABSPEC 1.025 05/29/2015 1150   PHURINE 7.0 05/29/2015 1150   GLUCOSEU NEGATIVE 05/29/2015 1150   HGBUR NEGATIVE 05/29/2015 [place] 05/29/2015 1150   KETONESUR NEGATIVE 05/29/2015 [place] 05/29/2015 1150   NITRITE NEGATIVE 05/29/2015  [place] 05/29/2015 1150    Radiological Exams on Admission: DG Chest Port 1 View  Result Date: 11/12/2020 CLINICAL DATA:  tachycardia COVID, high blood pressure and positive for COVID EXAM: PORTABLE CHEST 1 VIEW COMPARISON:  None. FINDINGS: Mildly enlarged heart. There is no focal airspace consolidation. There is no large pleural effusion or visible pneumothorax. No acute osseous abnormality. IMPRESSION: No focal airspace consolidation.  Mildly enlarged heart. Electronically Signed   By: Maurine Simmering   On: 11/12/2020 17:19     EKG: Independently reviewed, with result as described above.    Assessment/Plan   Chaz Mcglasson is a 50 y.o. male with medical history significant for nephrolithiasis in December 2016 who is admitted to [facility] on 11/12/2020 by way of transfer from [place] with new diagnosis of atrial flutter with RVR after presenting from home to the latter facility complaining of palpitations.     Principal Problem:   Atrial flutter ([place]) Active Problems:   COVID-19 virus infection   Elevated troponin     #) Atrial flutter with RVR: In the setting of now known history of atrial flutter atrial fibrillation, the patient presented to [facility] ED earlier today for evaluation of palpitations at which time heart rate noted to be in the 180s, with initial EKG suggestive of atrial flutter with ventricular rates in the 180s.  Not associate with any chest pain.  Of note, blood pressures have tolerated these heart rates, without any ensuing development of hypotension following initiation of IV diltiazem drip, which is resulted in improving rate control into the 90s to low 100s.  EKG is performed at [facility] ED demonstrated no evidence of acute T wave or ST changes, including no evidence of ST elevation.  Initial high-sensitivity troponin I found to be mildly elevated at 27, although this is suspected to be on the basis of supply demand mismatch as a  consequence of atrial flutter with RVR as opposed to representing an NSTEMI resulting in atrial flutter, as further detailed below.  In terms of factors predisposing patient to development of atrial flutter with RVR, suspect physiologic stresses stemming from COVID-19 infection, with initial development of symptoms consistent with this diagnosis starting 4 to 5 days ago, as further detailed below.  No history of underlying heart failure, and the patient does not believe he has ever previously undergone echocardiogram. In the setting of a CHA2DS2-VASc score of 0, pending evaluation via echocardiogram, there is not an indication for the patient to be on chronic anticoagulation for thromboembolic prophylaxis.    Plan: Continue  diltiazem drip.  Monitor on telemetry.  Echocardiogram ordered for the morning.  Monitor strict I's and O's and daily weights.  Check serum magnesium level with prn supplementation to maintain levels of greater than or equal to 2.0. Repeat BMP in the AM.  Repeat CBC in the AM.  Further evaluation management of COVID-19 infection, as further detailed above.  Monitor on continuous pulse oximetry.  Check TSH and urinary drug screen.  will also check urinalysis to evaluate for any additional source of underlying infection.  Continue to trend troponin, as further detailed below.      #) COVID-19 infection: diagnosis on the basis of: 4 to 5 days of progressive subjective fever, chills, and progressive rhinitis, with nasopharyngeal COVID-19 PCR performed at [facility] ED today found to be positive. Of note, presentation does not appear to be associated with acute hypoxic respiratory distress/failure, with patient maintaining O2 sats greater than 94% on room air. Overall, it does not appear that criteria are met at the present time for patient's COVID-19 infection to be considered severe in nature. Consequently, there does not appear to be an indication at this time for initiation of dexamethasone  per treatment guidance recommendations from [facility]'s Covid Treatment Guidelines.  Chest x-ray demonstrates no evidence of acute cardiopulmonary process.  Of note, in the setting of presenting BMI greater than 30, this patient meets criteria to be considered high risk for a more complicated clinical course of COVID-19 infection, including increased probability for progression of the severity associated with this infection. Therefore, in the setting of symptomatic COVID-19 infection requiring hospitalization for further evaluation and management thereof in this patient with the aforementioned high risk criteria who is felt to be early in the course of their infection given onset of respiratory symptoms starting less than 7 days ago, indications are met for initiation of remdesivir per treatment guidance recommendations from [facility]'s Covid Treatment Guidelines. Of note, ALT found to be less than 220. Therefore, there is no contraindication for initiation of remdesivir on the basis of transaminitis.   No known chronic underlying pulmonary pathology or diabetes.  Lifelong non-smoker.  Denies any known or suspected COVID-19 exposures.     Plan: Airborne and contact precautions. Monitor continuous pulse oximetry and monitor on telemetry. prn supplemental O2 to maintain O2 sats greater than or equal to 94%. Proning protocol initiated. PRN albuterol inhaler. PRN acetaminophen for fever. Start remdesivir, as above. Refraining from initiation of dexamethasone, as above. Check inflammatory markers (fibrinogen, d dimer or fibrin derivatives, crp, ferritin, LDH) in the morning. Check serum magnesium and phosphorus levels. Check CMP and CBC in the morning. Flutter valve and incentive spirometry.  Add on procalcitonin level.     #) Elevated troponin: mildly elevated initial troponin of 27 in the absence of any prior troponin data points available for point comparison. Suspect that this mildly elevated  troponin is on the basis of supply demand mismatch in the setting of diminished diastolic filling time is a consequence of presenting atrial flutter with RVR as opposed to representing a type I process due to acute plaque rupture.  EKG following interval improvement in rate control shows no evidence of acute T wave or ST changes, including no evidence of ST elevation, while chest x-ray shows no evidence of acute cardiopulmonary process, including no evidence of pneumothorax.  Initially [place] presentation is not associate with any chest pain.  Overall, ACS is felt to be less likely relative to type II supply demand mismatch, as above, but  will closely monitor on telemetry overnight while treating suspected underlying atrial flutter as well as COVID-19 infection, as further described above.  Additionally, acute pulmonary embolism appears clinically less likely at this time.  No known history of underlying coronary artery disease.   Plan: Continue to trend serial troponin. Monitor on telemetry. PRN EKG for development of chest pain. Check serum Mg level and repeat BMP in the morning, with prn supplementation to maintain Mg and potassium levels greater than or equal to 2.0 and 4.0, respectively, to further reduce risk of ventricular arrhythmia. Repeat CBC in the AM. Additional evaluation and management of presenting atrial flutter with RVR as suspected driving force behind mildly elevated troponin, as above.  Echocardiogram has been ordered for the morning, as above.       DVT prophylaxis: Lovenox 40 mg subcu daily Code Status: Full code Family Communication: none Disposition Plan: Per Rounding Team Consults called: none  Admission status: Inpatient, PCU     Of note, this patient was added by me to the following Admit List/Treatment Team: [email or web address].      PLEASE NOTE THAT DRAGON DICTATION SOFTWARE WAS USED IN THE CONSTRUCTION OF THIS NOTE.   [place] Pager  [phone] From [place]  Otherwise, please contact night-coverage  [email or web address] Password [facility]   11/12/2020, 9:29 PM

## 2020-11-12 NOTE — ED Triage Notes (Signed)
Patient called his PCP regarding his elevated heart rate and was advised to come to their office.  PCP tested him for Covid + with an elevated HR of 154.

## 2020-11-12 NOTE — ED Notes (Signed)
CARDIAC ALARM ON - HR 172/MIN, RN AND ED MD TO BEDSIDE

## 2020-11-12 NOTE — ED Notes (Signed)
Cardizem gtt infusing at 5mg /min after 10mg  bolus, HR at times at 98-128/min, 12 lead ECG repeated

## 2020-11-12 NOTE — ED Notes (Signed)
12 lead ECG done prior to Cardizem gtt

## 2020-11-12 NOTE — ED Provider Notes (Signed)
[place] EMERGENCY DEPT Provider Note   CSN: [id number] Arrival date & time: 11/12/20  1437     History Chief Complaint  Patient presents with  . Tachycardia    Caleb Cannon is a 50 y.o. male.  Patient on Sunday started with nasal congestion fever and would have some diaphoresis with the fever.  No nausea or vomiting no body aches no chest pain no shortness of breath starting yesterday felt like his heart rate was fast.  He went to his primary care office [facility] because of that.  They did a COVID test there that was positive and noted that he had a very fast heart rate.  Patient arrived here with a heart rate of 160.  Highest it went up to was 180.  With slow down it would wax and wane and when he got down in the 130s you could see a sawtooth pattern consistent with a flutter.  Patient is never had any problems like this before.  Patient has not had any of the vaccines.        History reviewed. No pertinent past medical history.  There are no problems to display for this patient.   Past Surgical History:  Procedure Laterality Date  . WISDOM TOOTH EXTRACTION  1996       Family History  Problem Relation Age of Onset  . Colon polyps Neg Hx   . Colon cancer Neg Hx   . Esophageal cancer Neg Hx   . Rectal cancer Neg Hx   . Stomach cancer Neg Hx     Social History   Tobacco Use  . Smoking status: Never Smoker  . Smokeless tobacco: Never Used  Vaping Use  . Vaping Use: Never used  Substance Use Topics  . Alcohol use: No    Comment: 2 timse per year  . Drug use: Never    Home Medications Prior to Admission medications   Not on File    Allergies    Patient has no known allergies.  Review of Systems   Review of Systems  Constitutional: Positive for diaphoresis and fever. Negative for chills.  HENT: Positive for congestion. Negative for rhinorrhea and sore throat.   Eyes: Negative for visual disturbance.  Respiratory: Negative for  cough and shortness of breath.   Cardiovascular: Positive for palpitations. Negative for chest pain and leg swelling.  Gastrointestinal: Negative for abdominal pain, diarrhea, nausea and vomiting.  Genitourinary: Negative for dysuria.  Musculoskeletal: Negative for back pain and neck pain.  Skin: Negative for rash.  Neurological: Negative for dizziness, light-headedness and headaches.  Hematological: Does not bruise/bleed easily.  Psychiatric/Behavioral: Negative for confusion.    Physical Exam Updated Vital Signs BP (!) 128/94 (BP Location: Right Arm)   Pulse (!) 118   Temp 98.4 F (36.9 C) (Oral)   Resp (!) 22   Ht 1.778 m (5\' 10" )   Wt 108.9 kg   SpO2 99%   BMI 34.44 kg/m   Physical Exam Vitals and nursing note reviewed.  Constitutional:      Appearance: Normal appearance. He is well-developed.  HENT:     Head: Normocephalic and atraumatic.     Mouth/Throat:     Mouth: Mucous membranes are moist.  Eyes:     Extraocular Movements: Extraocular movements intact.     Conjunctiva/sclera: Conjunctivae normal.     Pupils: Pupils are equal, round, and reactive to light.  Cardiovascular:     Rate and Rhythm: Tachycardia present. Rhythm irregular.  Heart sounds: No murmur heard.   Pulmonary:     Effort: Pulmonary effort is normal. No respiratory distress.     Breath sounds: Normal breath sounds.  Abdominal:     Palpations: Abdomen is soft.     Tenderness: There is no abdominal tenderness.  Musculoskeletal:        General: No swelling. Normal range of motion.     Cervical back: Normal range of motion and neck supple.  Skin:    General: Skin is warm and dry.     Capillary Refill: Capillary refill takes less than 2 seconds.  Neurological:     General: No focal deficit present.     Mental Status: He is alert and oriented to person, place, and time.     Cranial Nerves: No cranial nerve deficit.     Sensory: No sensory deficit.     Motor: No weakness.     ED  Results / Procedures / Treatments   Labs (all labs ordered are listed, but only abnormal results are displayed) Labs Reviewed  BASIC METABOLIC PANEL - Abnormal; Notable for the following components:      Result Value   Glucose, Bld 101 (*)    All other components within normal limits  HEPATIC FUNCTION PANEL - Abnormal; Notable for the following components:   Total Protein 6.2 (*)    All other components within normal limits  TROPONIN I (HIGH SENSITIVITY) - Abnormal; Notable for the following components:   Troponin I (High Sensitivity) 27 (*)    All other components within normal limits  RESP PANEL BY RT-PCR (FLU A&B, COVID) [id number]  CBC    EKG EKG Interpretation  Date/Time:  Friday Nov 12 2020 15:40:51 EDT Ventricular Rate:  160 PR Interval:  104 QRS Duration: 114 QT Interval:  318 QTC Calculation: 518 R Axis:   79 Text Interpretation: Sinus tachycardia with short PR Nonspecific ST and T wave abnormality Abnormal ECG No previous ECGs available Confirmed by Fredia Sorrow [phone]) on 11/12/2020 3:47:56 PM   Radiology No results found.  Procedures Procedures   CRITICAL CARE Performed by: Fredia Sorrow Total critical care time:  60 minutes Critical care time was exclusive of separately billable procedures and treating other patients. Critical care was necessary to treat or prevent imminent or life-threatening deterioration. Critical care was time spent personally by me on the following activities: development of treatment plan with patient and/or surrogate as well as nursing, discussions with consultants, evaluation of patient's response to treatment, examination of patient, obtaining history from patient or surrogate, ordering and performing treatments and interventions, ordering and review of laboratory studies, ordering and review of radiographic studies, pulse oximetry and re-evaluation of patient's condition.  Medications Ordered in ED Medications  0.9 %  sodium  chloride infusion ( Intravenous New Bag/Given 11/12/20 1555)  sodium chloride 0.9 % bolus 1,000 mL (has no administration in time range)  diltiazem (CARDIZEM) 1 mg/mL load via infusion 10 mg (10 mg Intravenous Bolus from Bag 11/12/20 1623)    And  diltiazem (CARDIZEM) 125 mg in dextrose 5% 125 mL (1 mg/mL) infusion (5 mg/hr Intravenous New Bag/Given 11/12/20 1622)    ED Course  I have reviewed the triage vital signs and the nursing notes.  Pertinent labs & imaging results that were available during my care of the patient were reviewed by me and considered in my medical decision making (see chart for details).    MDM Rules/Calculators/A&P  Patient with symptoms consistent with COVID onset on Sunday.  Had positive test to [facility] repeated here confirms positive COVID.  Patient arrived here with significant tachycardia with heart rate in the range of 1 60-1 80.  With slow down we could see flutter waves.  Patient received 10 mg of diltiazem brought his heart rate down into like 135.  Definitely could see flutter waves and then it went back up so he was started on a drip.  Now under drip his heart rates down in the 80s.  Never had any chest pain never had any shortness of breath.  His oxygen levels always been in the upper 90s.  Temperature was 98.4.  Blood pressure was originally 106.  But with the rate control blood pressure is now up to like 972 systolic.  Discussed with the hospitalist at [facility] and they will admit patient to the progressive unit.  Patient much improved on the diltiazem drip.  Clinically much more stable.  Patient again not tachycardic not hypoxic no chest pain no shortness of breath.  So no immediate concern for pulmonary embolus.  Patient did have an initial troponin which was 27.  Delta troponin pending.  But since has been in this fast heart rate since yesterday would expect some increase in the troponins.  Again no chest pain.  Chest x-ray  had no acute findings other than a slightly enlarged heart.    Final Clinical Impression(s) / ED Diagnoses Final diagnoses:  COVID  Atrial flutter with rapid ventricular response [facility])    Rx / DC Orders ED Discharge Orders    None       Fredia Sorrow, MD 11/12/20 1824

## 2020-11-12 NOTE — ED Notes (Signed)
VS were assessed when Cardizem gtt was started.

## 2020-11-13 ENCOUNTER — Other Ambulatory Visit (HOSPITAL_COMMUNITY): Payer: 59

## 2020-11-13 ENCOUNTER — Inpatient Hospital Stay (HOSPITAL_COMMUNITY): Payer: 59

## 2020-11-13 DIAGNOSIS — U071 COVID-19: Secondary | ICD-10-CM | POA: Diagnosis present

## 2020-11-13 DIAGNOSIS — I4891 Unspecified atrial fibrillation: Secondary | ICD-10-CM

## 2020-11-13 DIAGNOSIS — R778 Other specified abnormalities of plasma proteins: Secondary | ICD-10-CM | POA: Diagnosis present

## 2020-11-13 DIAGNOSIS — R7989 Other specified abnormal findings of blood chemistry: Secondary | ICD-10-CM | POA: Diagnosis present

## 2020-11-13 HISTORY — PX: TRANSTHORACIC ECHOCARDIOGRAM: SHX275

## 2020-11-13 LAB — COMPREHENSIVE METABOLIC PANEL
ALT: 36 U/L (ref 0–44)
AST: 30 U/L (ref 15–41)
Albumin: 3.6 g/dL (ref 3.5–5.0)
Alkaline Phosphatase: 46 U/L (ref 38–126)
Anion gap: 5 (ref 5–15)
BUN: 10 mg/dL (ref 6–20)
CO2: 23 mmol/L (ref 22–32)
Calcium: 8.6 mg/dL — ABNORMAL LOW (ref 8.9–10.3)
Chloride: 112 mmol/L — ABNORMAL HIGH (ref 98–111)
Creatinine, Ser: 0.94 mg/dL (ref 0.61–1.24)
GFR, Estimated: >60 mL/min (ref >60)
Glucose, Bld: 100 mg/dL — ABNORMAL HIGH (ref 70–99)
Potassium: 3.8 mmol/L (ref 3.5–5.1)
Sodium: 140 mmol/L (ref 135–145)
Total Bilirubin: 0.6 mg/dL (ref 0.3–1.2)
Total Protein: 5.8 g/dL — ABNORMAL LOW (ref 6.5–8.1)

## 2020-11-13 LAB — ECHOCARDIOGRAM LIMITED
Height: 70 in
Weight: 3964.8 oz

## 2020-11-13 LAB — RAPID URINE DRUG SCREEN, HOSP PERFORMED
Amphetamines: NOT DETECTED
Barbiturates: NOT DETECTED
Benzodiazepines: NOT DETECTED
Cocaine: NOT DETECTED
Opiates: NOT DETECTED
Tetrahydrocannabinol: NOT DETECTED

## 2020-11-13 LAB — FERRITIN: Ferritin: 197 ng/mL (ref 24–336)

## 2020-11-13 LAB — URINALYSIS, ROUTINE W REFLEX MICROSCOPIC
Bilirubin Urine: NEGATIVE
Glucose, UA: NEGATIVE mg/dL
Hgb urine dipstick: NEGATIVE
Ketones, ur: NEGATIVE mg/dL
Leukocytes,Ua: NEGATIVE
Nitrite: NEGATIVE
Protein, ur: NEGATIVE mg/dL
Specific Gravity, Urine: 1.011 (ref 1.005–1.030)
pH: 6 (ref 5.0–8.0)

## 2020-11-13 LAB — LACTATE DEHYDROGENASE: LDH: 155 U/L (ref 98–192)

## 2020-11-13 LAB — CBC
HCT: 40.5 % (ref 39.0–52.0)
Hemoglobin: 14.2 g/dL (ref 13.0–17.0)
MCH: 30.2 pg (ref 26.0–34.0)
MCHC: 35.1 g/dL (ref 30.0–36.0)
MCV: 86.2 fL (ref 80.0–100.0)
Platelets: 189 K/uL (ref 150–400)
RBC: 4.7 MIL/uL (ref 4.22–5.81)
RDW: 12.2 % (ref 11.5–15.5)
WBC: 5.1 K/uL (ref 4.0–10.5)
nRBC: 0 % (ref 0.0–0.2)

## 2020-11-13 LAB — TROPONIN I (HIGH SENSITIVITY)
Troponin I (High Sensitivity): 25 ng/L — ABNORMAL HIGH
Troponin I (High Sensitivity): 24 ng/L — ABNORMAL HIGH (ref <18)

## 2020-11-13 LAB — PROCALCITONIN: Procalcitonin: <0.1 ng/mL

## 2020-11-13 LAB — TSH: TSH: 2.155 u[IU]/mL (ref 0.350–4.500)

## 2020-11-13 LAB — MAGNESIUM
Magnesium: 2.2 mg/dL (ref 1.7–2.4)
Magnesium: 2.3 mg/dL (ref 1.7–2.4)

## 2020-11-13 LAB — GLUCOSE, CAPILLARY: Glucose-Capillary: 99 mg/dL (ref 70–99)

## 2020-11-13 LAB — D-DIMER, QUANTITATIVE: D-Dimer, Quant: 0.44 ug/mL-FEU (ref 0.00–0.50)

## 2020-11-13 LAB — HIV ANTIBODY (ROUTINE TESTING W REFLEX): HIV Screen 4th Generation wRfx: NONREACTIVE

## 2020-11-13 LAB — PHOSPHORUS: Phosphorus: 3.4 mg/dL (ref 2.5–4.6)

## 2020-11-13 LAB — C-REACTIVE PROTEIN: CRP: <0.5 mg/dL (ref <1.0)

## 2020-11-13 LAB — FIBRINOGEN: Fibrinogen: 284 mg/dL (ref 210–475)

## 2020-11-13 MED ORDER — ENOXAPARIN SODIUM 60 MG/0.6ML IJ SOSY: 55.0000 mg | PREFILLED_SYRINGE | INTRAMUSCULAR | Status: DC

## 2020-11-13 MED ORDER — DILTIAZEM HCL ER COATED BEADS 180 MG PO CP24: 180.0000 mg | ORAL_CAPSULE | Freq: Every day | ORAL | 0 refills | Status: DC

## 2020-11-13 MED ORDER — SODIUM CHLORIDE 0.9 % IV SOLN
200.0000 mg | Freq: Once | INTRAVENOUS | Status: AC
  Administered 2020-11-13: 200 mg via INTRAVENOUS
  Filled 2020-11-13: qty 40

## 2020-11-13 MED ORDER — DILTIAZEM HCL 60 MG PO TABS
60.0000 mg | ORAL_TABLET | Freq: Three times a day (TID) | ORAL | Status: DC
  Administered 2020-11-13 (×2): 60 mg via ORAL
  Filled 2020-11-13 (×3): qty 1

## 2020-11-13 MED ORDER — SODIUM CHLORIDE 0.9 % IV SOLN: 100.0000 mg | Freq: Every day | INTRAVENOUS | Status: DC

## 2020-11-13 NOTE — Progress Notes (Signed)
Patient ambulating independently in room.  Denies dizziness or SOB, HR 80 bpm at rest and increasing to 88-94 bpm with ambulation. Remains in afib at this time.

## 2020-11-13 NOTE — Discharge Instructions (Signed)
COVID-19 Quarantine vs. Isolation QUARANTINE keeps someone who was in close contact with someone who has COVID-19 away from others. Quarantine if you have been in close contact with someone who has COVID-19, unless you have been fully vaccinated. If you are fully vaccinated  You do NOT need to quarantine unless they have symptoms  Get tested 3-5 days after your exposure, even if you don't have symptoms  Wear a mask indoors in public for 14 days following exposure or until your test result is negative If you are not fully vaccinated  Stay home for 14 days after your last contact with a person who has COVID-19  Watch for fever (100.38F), cough, shortness of breath, or other symptoms of COVID-19  If possible, stay away from people you live with, especially people who are at higher risk for getting very sick from COVID-19  Contact your local public health department for options in your area to possibly shorten your quarantine ISOLATION keeps someone who is sick or tested positive for COVID-19 without symptoms away from others, even in their own home. People who are in isolation should stay home and stay in a specific "sick room" or area and use a separate bathroom (if available). If you are sick and think or know you have COVID-19 Stay home until after  At least 10 days since symptoms first appeared and  At least 24 hours with no fever without the use of fever-reducing medications and  Symptoms have improved If you tested positive for COVID-19 but do not have symptoms  Stay home until after 10 days have passed since your positive viral test  If you develop symptoms after testing positive, follow the steps above for those who are sick [email or web address] 03/15/2020 This information is not intended to replace advice given to you by your health care provider. Make sure you discuss any questions you have with your health care provider. Document Revised: 04/19/2020 Document Reviewed:  04/19/2020 Elsevier Patient Education  2021 [place]. Diltiazem Tablets What is this medicine? DILTIAZEM (dil TYE a zem) is a calcium channel blocker. It relaxes your blood vessels and decreases the amount of work the heart has to do. It treats and/or prevents chest pain (also called angina). This medicine may be used for other purposes; ask your health care provider or pharmacist if you have questions. COMMON BRAND NAME(S): Cardizem What should I tell my health care provider before I take this medicine? They need to know if you have any of these conditions:  heart attack  heart disease  irregular heartbeat or rhythm  low blood pressure  an unusual or allergic reaction to diltiazem, other drugs, foods, dyes, or preservatives  pregnant or trying to get pregnant  breast-feeding How should I use this medicine? Take this drug by mouth. Take it as directed on the prescription label at the same time every day. Keep taking it unless your health care provider tells you to stop. Talk to your health care provider about the use of this drug in children. Special care may be needed. Overdosage: If you think you have taken too much of this medicine contact a poison control center or emergency room at once. NOTE: This medicine is only for you. Do not share this medicine with others. What if I miss a dose? If you miss a dose, take it as soon as you can. If it is almost time for your next dose, take only that dose. Do not take double or extra doses. What may interact with  this medicine? Do not take this medicine with any of the following:  cisapride  hawthorn  pimozide  ranolazine  red yeast rice This medicine may also interact with the following medications:  buspirone  carbamazepine  cimetidine  cyclosporine  digoxin  local anesthetics or general anesthetics  lovastatin  medicines for anxiety or difficulty sleeping like midazolam and triazolam  medicines for high  blood pressure or heart problems  quinidine  rifampin, rifabutin, or rifapentine This list may not describe all possible interactions. Give your health care provider a list of all the medicines, herbs, non-prescription drugs, or dietary supplements you use. Also tell them if you smoke, drink alcohol, or use illegal drugs. Some items may interact with your medicine. What should I watch for while using this medicine? Visit your care team for regular checks on your progress. Check your blood pressure as directed. Ask your care team what your blood pressure should be. Also, find out when you should contact them. Do not treat yourself for coughs, colds, or pain while you are using this medication without asking your care team for advice. Some medications may increase your blood pressure. This medication may cause serious skin reactions. They can happen weeks to months after starting the medication. Contact your care team right away if you notice fevers or flu-like symptoms with a rash. The rash may be red or purple and then turn into blisters or peeling of the skin. Or, you might notice a red rash with swelling of the face, lips or lymph nodes in your neck or under your arms. You may get drowsy or dizzy. Do not drive, use machinery, or do anything that needs mental alertness until you know how this medication affects you. Do not stand up or sit up quickly, especially if you are an older patient. This reduces the risk of dizzy or fainting spells. What side effects may I notice from receiving this medicine? Side effects that you should report to your doctor or health care provider as soon as possible:  allergic reactions (skin rash, itching or hives; swelling of the face, lips, or tongue)  heart failure (trouble breathing; fast, irregular heartbeat; sudden weight gain; swelling of the ankles, feet, hands; unusually weak or tired)  heartbeat rhythm changes (trouble breathing; chest pain; dizziness; fast,  irregular heartbeat; feeling faint or lightheaded, falls)  liver injury (dark yellow or brown urine; general ill feeling or flu-like symptoms; loss of appetite, right upper belly pain; unusually weak or tired, yellowing of the eyes or skin)  low blood pressure (dizziness; feeling faint or lightheaded, falls; unusually weak or tired)  redness, blistering, peeling, or loosening of the skin, including inside the mouth Side effects that usually do not require medical attention (report to your doctor or health care provider if they continue or are bothersome):  changes in sex drive or performance  depressed mood  headache  sudden weight gain  nausea  trouble sleeping This list may not describe all possible side effects. Call your doctor for medical advice about side effects. You may report side effects to FDA at 1-800-FDA-1088. Where should I keep my medicine? Keep out of the reach of children and pets. Store at room temperature between 15 and 30 degrees C (59 and 86 degrees F). Protect from moisture. Keep the container tightly closed. Throw away any unused drug after the expiration date. NOTE: This sheet is a summary. It may not cover all possible information. If you have questions about this medicine, talk to your  doctor, pharmacist, or health care provider.  2021 Elsevier/Gold Standard (2020-04-22 15:46:34) Atrial Fibrillation  Atrial fibrillation is a type of irregular or rapid heartbeat (arrhythmia). In atrial fibrillation, the top part of the heart (atria) beats in an irregular pattern. This makes the heart unable to pump blood normally and effectively. The goal of treatment is to prevent blood clots from forming, control your heart rate, or restore your heartbeat to a normal rhythm. If this condition is not treated, it can cause serious problems, such as a weakened heart muscle (cardiomyopathy) or a stroke. What are the causes? This condition is often caused by medical conditions  that damage the heart's electrical system. These include:  High blood pressure (hypertension). This is the most common cause.  Certain heart problems or conditions, such as heart failure, coronary artery disease, heart valve problems, or heart surgery.  Diabetes.  Overactive thyroid (hyperthyroidism).  Obesity.  Chronic kidney disease. In some cases, the cause of this condition is not known. What increases the risk? This condition is more likely to develop in:  Older people.  People who smoke.  Athletes who do endurance exercise.  People who have a family history of atrial fibrillation.  Men.  People who use drugs.  People who drink a lot of alcohol.  People who have lung conditions, such as emphysema, pneumonia, or COPD.  People who have obstructive sleep apnea. What are the signs or symptoms? Symptoms of this condition include:  A feeling that your heart is racing or beating irregularly.  Discomfort or pain in your chest.  Shortness of breath.  Sudden light-headedness or weakness.  Tiring easily during exercise or activity.  Fatigue.  Syncope (fainting).  Sweating. In some cases, there are no symptoms. How is this diagnosed? Your health care provider may detect atrial fibrillation when taking your pulse. If detected, this condition may be diagnosed with:  An electrocardiogram (ECG) to check electrical signals of the heart.  An ambulatory cardiac monitor to record your heart's activity for a few days.  A transthoracic echocardiogram (TTE) to create pictures of your heart.  A transesophageal echocardiogram (TEE) to create even closer pictures of your heart.  A stress test to check your blood supply while you exercise.  Imaging tests, such as a CT scan or chest X-ray.  Blood tests. How is this treated? Treatment depends on underlying conditions and how you feel when you experience atrial fibrillation. This condition may be treated  with:  Medicines to prevent blood clots or to treat heart rate or heart rhythm problems.  Electrical cardioversion to reset the heart's rhythm.  A pacemaker to correct abnormal heart rhythm.  Ablation to remove the heart tissue that sends abnormal signals.  Left atrial appendage closure to seal the area where blood clots can form. In some cases, underlying conditions will be treated. Follow these instructions at home: Medicines  Take over-the counter and prescription medicines only as told by your health care provider.  Do not take any new medicines without talking to your health care provider.  If you are taking blood thinners: ? Talk with your health care provider before you take any medicines that contain aspirin or NSAIDs, such as ibuprofen. These medicines increase your risk for dangerous bleeding. ? Take your medicine exactly as told, at the same time every day. ? Avoid activities that could cause injury or bruising, and follow instructions about how to prevent falls. ? Wear a medical alert bracelet or carry a card that lists what medicines  you take. Lifestyle  Do not use any products that contain nicotine or tobacco, such as cigarettes, e-cigarettes, and chewing tobacco. If you need help quitting, ask your health care provider.  Eat heart-healthy foods. Talk with a dietitian to make an eating plan that is right for you.  Exercise regularly as told by your health care provider.  Do not drink alcohol.  Lose weight if you are overweight.  Do not use drugs, including cannabis.      General instructions  If you have obstructive sleep apnea, manage your condition as told by your health care provider.  Do not use diet pills unless your health care provider approves. Diet pills can make heart problems worse.  Keep all follow-up visits as told by your health care provider. This is important. Contact a health care provider if you:  Notice a change in the rate, rhythm, or  strength of your heartbeat.  Are taking a blood thinner and you notice more bruising.  Tire more easily when you exercise or do heavy work.  Have a sudden change in weight. Get help right away if you have:  Chest pain, abdominal pain, sweating, or weakness.  Trouble breathing.  Side effects of blood thinners, such as blood in your vomit, stool, or urine, or bleeding that cannot stop.  Any symptoms of a stroke. "BE FAST" is an easy way to remember the main warning signs of a stroke: ? B - Balance. Signs are dizziness, sudden trouble walking, or loss of balance. ? E - Eyes. Signs are trouble seeing or a sudden change in vision. ? F - Face. Signs are sudden weakness or numbness of the face, or the face or eyelid drooping on one side. ? A - Arms. Signs are weakness or numbness in an arm. This happens suddenly and usually on one side of the body. ? S - Speech. Signs are sudden trouble speaking, slurred speech, or trouble understanding what people say. ? T - Time. Time to call emergency services. Write down what time symptoms started.  Other signs of a stroke, such as: ? A sudden, severe headache with no known cause. ? Nausea or vomiting. ? Seizure. These symptoms may represent a serious problem that is an emergency. Do not wait to see if the symptoms will go away. Get medical help right away. Call your local emergency services (911 in the [place] Do not drive yourself to the hospital.   Summary  Atrial fibrillation is a type of irregular or rapid heartbeat (arrhythmia).  Symptoms include a feeling that your heart is beating fast or irregularly.  You may be given medicines to prevent blood clots or to treat heart rate or heart rhythm problems.  Get help right away if you have signs or symptoms of a stroke.  Get help right away if you cannot catch your breath or have chest pain or pressure. This information is not intended to replace advice given to you by your health care provider.  Make sure you discuss any questions you have with your health care provider. Document Revised: 11/27/2018 Document Reviewed: 11/27/2018 Elsevier Patient Education  [place].

## 2020-11-13 NOTE — Progress Notes (Signed)
  Echocardiogram 2D Echocardiogram has been performed.  Elmer Ramp 11/13/2020, 3:47 PM

## 2020-11-13 NOTE — Discharge Summary (Signed)
Physician Discharge Summary  Caleb Cannon AXK:[id number] DOB: [birth year] DOA: 11/12/2020  PCP: Haywood Pao, MD  Admit date: 11/12/2020 Discharge date: 11/13/2020  Admitted From: Home Disposition: Home  Recommendations for Outpatient Follow-up:  1. Follow up with PCP in 1-2 weeks 2. We will send referral to cardiology for outpatient follow-up  Home Health: Not applicable Equipment/Devices: Not applicable  Discharge Condition: Stable CODE STATUS: Full code Diet recommendation: Regular diet  Discharge summary:  50 year old gentleman with no medical issues, upper respiratory tract symptoms with rhinitis, low-grade fever at home for 5 days taking decongestant developed palpitation of 1 day and went to primary care office.  Tested positive for COVID-19 and also found to have a flutter with heart rate more than 150 so sent to ER.  Patient was complaining of palpitation. In the emergency room blood pressure is stable.  Initial heart rate of 180, Cardizem 10 mg IV once slowed down to 130 and was in a flutter and symptoms controlled.  Electrolytes were normal.  TSH was normal.  Sent to the hospital for admission on Cardizem infusion.  Symptoms rapidly improved.  Was on 5 mg of Cardizem in the morning, oral Cardizem 60 mg 3 times a day started.  Patient mobilized in the hospital.  Remains A. fib but rate controlled and symptom control.  Heart rate currently is 70. 2D echocardiogram with normal ejection fraction.  TSH was normal. CHA2DS2-VASc score 0, no indication for anticoagulation. Will allow rate control strategy.  He may have paroxysmal A. fib and aggravated by COVID-19 infection. We will send referral to cardiology for routine follow-up and consultation. Decrease caffeine intake.  COVID-19 infection: Mild URI symptoms for 5 days.  Over-the-counter supportive medications.  He will try to avoid decongestant.  He will take mucolytic's and cough medications without decongestant. Suggested  isolation precautions for 5 days and ongoing standard precautions.  Patient adequately stabilized and able to go home.    Discharge Diagnoses:  Principal Problem:   Atrial flutter ([place]) Active Problems:   COVID-19 virus infection   Elevated troponin    Discharge Instructions  Discharge Instructions    Ambulatory referral to Cardiology   Complete by: As directed    Call MD for:   Complete by: As directed    On going palpitations   Call MD for:  difficulty breathing, headache or visual disturbances   Complete by: As directed    Call MD for:  persistant dizziness or light-headedness   Complete by: As directed    Diet - low sodium heart healthy   Complete by: As directed    Increase activity slowly   Complete by: As directed      Allergies as of 11/13/2020   No Known Allergies     Medication List    TAKE these medications   diltiazem 180 MG 24 hr capsule Commonly known as: Cardizem CD Take 1 capsule (180 mg total) by mouth daily.       Follow-up Information    Tisovec, Fransico Him, MD Follow up in 2 week(s).   Specialty: Internal Medicine Contact information: [address] [place] [address] [phone]              No Known Allergies  Consultations:  None   Procedures/Studies: DG Chest Port 1 View  Result Date: 11/12/2020 CLINICAL DATA:  tachycardia COVID, high blood pressure and positive for COVID EXAM: PORTABLE CHEST 1 VIEW COMPARISON:  None. FINDINGS: Mildly enlarged heart. There is no focal airspace consolidation. There  is no large pleural effusion or visible pneumothorax. No acute osseous abnormality. IMPRESSION: No focal airspace consolidation.  Mildly enlarged heart. Electronically Signed   By: Maurine Simmering   On: 11/12/2020 17:19   ECHOCARDIOGRAM LIMITED  Result Date: 11/13/2020    ECHOCARDIOGRAM LIMITED REPORT   Patient Name:   Caleb Cannon Date of Exam: 11/13/2020 Medical Rec #:  [id number]   Height:       70.0 in Accession #:     [id number]  Weight:       247.8 lb Date of Birth:  08-31-1970    BSA:          2.286 m Patient Age:    34 years    BP:           113/74 mmHg Patient Gender: M           HR:           77 bpm. Exam Location:  Inpatient Procedure: 2D Echo and Color Doppler Indications:    Atrial fibrillation and Flutter  History:        Patient has no prior history of Echocardiogram examinations.                 Covid 19 positive.  Sonographer:    Merrie Roof RDCS Referring Phys: [id number] [place]  1. Limited study in Covid positive patient; not all views obtained.  2. Left ventricular ejection fraction, by estimation, is 55 to 60%. The left ventricle has normal function. The left ventricular internal cavity size was mildly dilated. There is mild left ventricular hypertrophy.  3. Right ventricular systolic function is normal. The right ventricular size is normal.  4. The mitral valve is normal in structure. No evidence of mitral valve regurgitation.  5. The aortic valve is tricuspid. Aortic valve regurgitation is not visualized.  6. The inferior vena cava is normal in size with greater than 50% respiratory variability, suggesting right atrial pressure of 3 mmHg. FINDINGS  Left Ventricle: Left ventricular ejection fraction, by estimation, is 55 to 60%. The left ventricle has normal function. The left ventricular internal cavity size was mildly dilated. There is mild left ventricular hypertrophy. Right Ventricle: The right ventricular size is normal. Right ventricular systolic function is normal. Left Atrium: Left atrial size was normal in size. Right Atrium: Right atrial size was normal in size. Pericardium: There is no evidence of pericardial effusion. Mitral Valve: The mitral valve is normal in structure. Tricuspid Valve: The tricuspid valve is normal in structure. Tricuspid valve regurgitation is trivial. Aortic Valve: The aortic valve is tricuspid. Aortic valve regurgitation is not visualized. Pulmonic Valve: The  pulmonic valve was not well visualized. Aorta: The aortic root is normal in size and structure. Venous: The inferior vena cava is normal in size with greater than 50% respiratory variability, suggesting right atrial pressure of 3 mmHg. Additional Comments: Limited study in Covid positive patient; not all views obtained. Kirk Ruths MD Electronically signed by Kirk Ruths MD Signature Date/Time: 11/13/2020/4:31:08 PM    Final    (Echo, Carotid, EGD, Colonoscopy, ERCP)    Subjective: Patient seen and examined.  In the morning rounds he was on 5 mg of Cardizem drip and was without any problem.  Examined in the evening with wife at the bedside, patient is eager to go home denies any complaints. Telemetry shows A. fib with ventricular rate 70-80.   Discharge Exam: Vitals:   11/13/20 1116 11/13/20 1525  BP: 124/79 113/74  Pulse:  94 95  Resp: 20 20  Temp: 98.6 F (37 C) 98.4 F (36.9 C)  SpO2: 97% 97%   Vitals:   11/13/20 0315 11/13/20 0816 11/13/20 1116 11/13/20 1525  BP: 130/89 128/81 124/79 113/74  Pulse: 80 91 94 95  Resp: 18 18 20 20   Temp: 98 F (36.7 C) 98.4 F (36.9 C) 98.6 F (37 C) 98.4 F (36.9 C)  TempSrc: Oral Oral Oral Oral  SpO2: 95% 96% 97% 97%  Weight: 112.4 kg     Height:        General: Pt is alert, awake, not in acute distress Walking around in the room. Cardiovascular: Irregularly irregular.  S1/S2 +, no rubs, no gallops Respiratory: CTA bilaterally, no wheezing, no rhonchi Abdominal: Soft, NT, ND, bowel sounds + Extremities: no edema, no cyanosis    The results of significant diagnostics from this hospitalization (including imaging, microbiology, ancillary and laboratory) are listed below for reference.     Microbiology: Recent Results (from the past 240 hour(s))  Resp Panel by RT-PCR (Flu A&B, Covid) Nasopharyngeal Swab     Status: Abnormal   Collection Time: 11/12/20  4:21 PM   Specimen: Nasopharyngeal Swab; Nasopharyngeal(NP) swabs in vial  transport medium  Result Value Ref Range Status   SARS Coronavirus 2 by RT PCR POSITIVE (A) NEGATIVE Final    Comment: RESULT CALLED TO, READ BACK BY AND VERIFIED WITH: TATA CARBONNE RN [id number] 11/12/2020 [place] (NOTE) SARS-CoV-2 target nucleic acids are DETECTED.  The SARS-CoV-2 RNA is generally detectable in upper respiratory specimens during the acute phase of infection. Positive results are indicative of the presence of the identified virus, but do not rule out bacterial infection or co-infection with other pathogens not detected by the test. Clinical correlation with patient history and other diagnostic information is necessary to determine patient infection status. The expected result is Negative.  Fact Sheet for Patients: EntrepreneurPulse.com.au  Fact Sheet for Healthcare Providers: IncredibleEmployment.be  This test is not yet approved or cleared by the [place] FDA and  has been authorized for detection and/or diagnosis of SARS-CoV-2 by FDA under an Emergency Use Authorization (EUA).  This EUA will remain in effect (meaning this test can  be used) for the duration of  the COVID-19 declaration under Section 564(b)(1) of the Act, 21 U.S.C. section 360bbb-3(b)(1), unless the authorization is terminated or revoked sooner.     Influenza A by PCR NEGATIVE NEGATIVE Final   Influenza B by PCR NEGATIVE NEGATIVE Final    Comment: (NOTE) The Xpert Xpress SARS-CoV-2/FLU/RSV plus assay is intended as an aid in the diagnosis of influenza from Nasopharyngeal swab specimens and should not be used as a sole basis for treatment. Nasal washings and aspirates are unacceptable for Xpert Xpress SARS-CoV-2/FLU/RSV testing.  Fact Sheet for Patients: EntrepreneurPulse.com.au  Fact Sheet for Healthcare Providers: IncredibleEmployment.be  This test is not yet approved or cleared by the [place] FDA and has been  authorized for detection and/or diagnosis of SARS-CoV-2 by FDA under an Emergency Use Authorization (EUA). This EUA will remain in effect (meaning this test can be used) for the duration of the COVID-19 declaration under Section 564(b)(1) of the Act, 21 U.S.C. section 360bbb-3(b)(1), unless the authorization is terminated or revoked.  Performed at [facility], [address], [place], [place] [address]      Labs: BNP (last 3 results) No results for input(s): BNP in the last 8760 hours. Basic Metabolic Panel: Recent Labs  Lab 11/12/20 1533 11/12/20 2245 11/13/20  0137  NA 142  --  140  K 4.1  --  3.8  CL 107  --  112*  CO2 26  --  23  GLUCOSE 101*  --  100*  BUN 14  --  10  CREATININE 1.02  --  0.94  CALCIUM 9.3  --  8.6*  MG  --  2.2 2.3  PHOS  --   --  3.4   Liver Function Tests: Recent Labs  Lab 11/12/20 1621 11/13/20 0137  AST 30 30  ALT 36 36  ALKPHOS 50 46  BILITOT 0.7 0.6  PROT 6.2* 5.8*  ALBUMIN 4.0 3.6   No results for input(s): LIPASE, AMYLASE in the last 168 hours. No results for input(s): AMMONIA in the last 168 hours. CBC: Recent Labs  Lab 11/12/20 1533 11/13/20 0137  WBC 4.8 5.1  HGB 15.4 14.2  HCT 45.2 40.5  MCV 86.1 86.2  PLT 223 189   Cardiac Enzymes: No results for input(s): CKTOTAL, CKMB, CKMBINDEX, TROPONINI in the last 168 hours. BNP: Invalid input(s): POCBNP CBG: Recent Labs  Lab 11/13/20 1113  GLUCAP 99   D-Dimer Recent Labs    11/13/20 0302  DDIMER 0.44   Hgb A1c No results for input(s): HGBA1C in the last 72 hours. Lipid Profile No results for input(s): CHOL, HDL, LDLCALC, TRIG, CHOLHDL, LDLDIRECT in the last 72 hours. Thyroid function studies Recent Labs    11/13/20 0302  TSH 2.155   Anemia work up Recent Labs    11/13/20 0302  FERRITIN 197   Urinalysis    Component Value Date/Time   COLORURINE YELLOW 11/13/2020 0618   APPEARANCEUR CLEAR 11/13/2020 0618   LABSPEC 1.011 11/13/2020  0618   PHURINE 6.0 11/13/2020 0618   GLUCOSEU NEGATIVE 11/13/2020 0618   HGBUR NEGATIVE 11/13/2020 0618   BILIRUBINUR NEGATIVE 11/13/2020 0618   KETONESUR NEGATIVE 11/13/2020 0618   PROTEINUR NEGATIVE 11/13/2020 0618   NITRITE NEGATIVE 11/13/2020 0618   LEUKOCYTESUR NEGATIVE 11/13/2020 0618   Sepsis Labs Invalid input(s): PROCALCITONIN,  WBC,  LACTICIDVEN Microbiology Recent Results (from the past 240 hour(s))  Resp Panel by RT-PCR (Flu A&B, Covid) Nasopharyngeal Swab     Status: Abnormal   Collection Time: 11/12/20  4:21 PM   Specimen: Nasopharyngeal Swab; Nasopharyngeal(NP) swabs in vial transport medium  Result Value Ref Range Status   SARS Coronavirus 2 by RT PCR POSITIVE (A) NEGATIVE Final    Comment: RESULT CALLED TO, READ BACK BY AND VERIFIED WITH: TATA CARBONNE RN 0350 11/12/2020 [place] (NOTE) SARS-CoV-2 target nucleic acids are DETECTED.  The SARS-CoV-2 RNA is generally detectable in upper respiratory specimens during the acute phase of infection. Positive results are indicative of the presence of the identified virus, but do not rule out bacterial infection or co-infection with other pathogens not detected by the test. Clinical correlation with patient history and other diagnostic information is necessary to determine patient infection status. The expected result is Negative.  Fact Sheet for Patients: EntrepreneurPulse.com.au  Fact Sheet for Healthcare Providers: IncredibleEmployment.be  This test is not yet approved or cleared by the [place] FDA and  has been authorized for detection and/or diagnosis of SARS-CoV-2 by FDA under an Emergency Use Authorization (EUA).  This EUA will remain in effect (meaning this test can  be used) for the duration of  the COVID-19 declaration under Section 564(b)(1) of the Act, 21 U.S.C. section 360bbb-3(b)(1), unless the authorization is terminated or revoked sooner.     Influenza A by PCR  NEGATIVE NEGATIVE Final   Influenza B by PCR NEGATIVE NEGATIVE Final    Comment: (NOTE) The Xpert Xpress SARS-CoV-2/FLU/RSV plus assay is intended as an aid in the diagnosis of influenza from Nasopharyngeal swab specimens and should not be used as a sole basis for treatment. Nasal washings and aspirates are unacceptable for Xpert Xpress SARS-CoV-2/FLU/RSV testing.  Fact Sheet for Patients: EntrepreneurPulse.com.au  Fact Sheet for Healthcare Providers: IncredibleEmployment.be  This test is not yet approved or cleared by the [place] FDA and has been authorized for detection and/or diagnosis of SARS-CoV-2 by FDA under an Emergency Use Authorization (EUA). This EUA will remain in effect (meaning this test can be used) for the duration of the COVID-19 declaration under Section 564(b)(1) of the Act, 21 U.S.C. section 360bbb-3(b)(1), unless the authorization is terminated or revoked.  Performed at [facility], [address], [place], [place] [address]      Time coordinating discharge:  35 minutes  SIGNED:   Barb Merino, MD  [facility] 11/13/2020, 5:33 PM

## 2021-01-13 ENCOUNTER — Ambulatory Visit (INDEPENDENT_AMBULATORY_CARE_PROVIDER_SITE_OTHER): Payer: 59 | Admitting: Cardiology

## 2021-01-13 ENCOUNTER — Encounter: Payer: Self-pay | Admitting: Cardiology

## 2021-01-13 ENCOUNTER — Other Ambulatory Visit: Payer: Self-pay

## 2021-01-13 VITALS — BP 126/78 | HR 64 | Ht 70.0 in | Wt 243.8 lb

## 2021-01-13 DIAGNOSIS — I4891 Unspecified atrial fibrillation: Secondary | ICD-10-CM

## 2021-01-13 DIAGNOSIS — I4892 Unspecified atrial flutter: Secondary | ICD-10-CM

## 2021-01-13 DIAGNOSIS — R778 Other specified abnormalities of plasma proteins: Secondary | ICD-10-CM

## 2021-01-13 MED ORDER — DILTIAZEM HCL 60 MG PO TABS: 60.0000 mg | ORAL_TABLET | Freq: Four times a day (QID) | ORAL | 11 refills | Status: DC | PRN

## 2021-01-13 MED ORDER — ASPIRIN EC 81 MG PO TBEC: 81.0000 mg | DELAYED_RELEASE_TABLET | ORAL | 3 refills | Status: AC

## 2021-01-13 NOTE — Progress Notes (Signed)
Primary Care Provider: Tisovec, Fransico Him, MD Cardiologist: Glenetta Hew, MD Electrophysiologist: None  Clinic Note: Chief Complaint  Patient presents with   Hospitalization Follow-up    Had A. fib RVR in setting of COVID-19 related URI symptoms and taking Sudafed.     ===================================  ASSESSMENT/PLAN   Problem List Items Addressed This Visit       Cardiology Problems   New onset atrial fibrillation ([place]) - Primary    He had probably more atrial fibrillation.  Atrial flutter will be consistent with a heart rate of 150 but not 180 or 160.  Also atrial fibrillation is probably more likely to have occurred in the setting of Sudafed.    Most likely triggered incident with combination of acute illness and Sudafed, however he may still have the substrate now going forward.  This patients CHA2DS2-VASc Score and unadjusted Ischemic Stroke Rate (% per year) is equal to 0.2 % stroke rate/year from a score of 0  Above score calculated as 1 point each if present [CHF, HTN, DM, Vascular=MI/PAD/Aortic Plaque, Age if 65-74, or Male] Above score calculated as 2 points each if present [Age > 75, or Stroke/TIA/TE]  Plan: With a very low CHA2DS2-VASc score, I think he is probably fine not being on a DOAC. Recommend 81 mg aspirin 3 to 4 days a week. If he does have episode of breakthrough tachycardia/irregular heartbeat episode, recommend taking 4 baby aspirin. Diltiazem 60 mg p.o. PRN tachycardia.  May take up to every 6 hours for breakthrough spells of A. Fib. If he were to have a break episode that is not triggered, would then need to potentially consider other etiologies look into stress testing.      Relevant Medications   diltiazem (CARDIZEM) 60 MG tablet   aspirin EC 81 MG tablet   Other Relevant Orders   EKG 12-Lead (Completed)     Other   Elevated troponin    Positive troponin was probably Demand Ischemia in the setting of A. fib RVR and acute illness.   Echo was normal.        ===================================  HPI:    Caleb Cannon is a 50 y.o. male with no prior cardiac history, who is being seen today for [place] (with RVR) at the request of Barb Merino, MD. ([facility])  Recent Hospitalizations:  Overnight admission 5/27-28/2022: URI Sx - rhinitis low-grade fever,  x 5 D --was taking OTC decongestants, and developed palpitations after 1 day.  He went to his PCP.  Was tested positive for COVID-19 but also found to have rapid A. fib/flutter with a rate of 150 bpm. ->  Heart rate upon arrival to the ER was 180 (too fast to be Atrial Flutter) --> he was treated with IV Cardizem bolus 10 mg followed by drip. TSH and electrolytes were normal. With heart rate control, symptoms notably improved. => Diltiazem was consolidated to 60 mg 3 times daily in the hospital.  He was still in atrial fibrillation, but rate controlled in the 70s.  He was not discharged home on DOAC because of CHA2DS2-VASc considered to be 0.  He was discharged home on diltiazem 180 mg daily-unfortunately he has not been taking it..   Reviewed  CV studies:    The following studies were reviewed today: (if available, images/films reviewed: From Epic Chart or Care Everywhere) TTE 11/05/2020: Normal LVEF of 55 to 60%.  Mildly dilated LV with mild LVH.  Normal RV.  Normal valves.  Normal RAP.   Interval History:   Caleb Cannon Presents here for hospital follow-up stating he has been doing fine since his discharge.  He has not had any further episodes of fast heart rate spells.  He really attributes the episode to the fact that he took couple doses of Sudafed for his URI symptoms.  He has never had any issues with this but that palpitation issue before.  He basely felt his heart was running away from him.  He has never had any like that he has occasional mild skipping beats here and there but nothing like this hospital  stay.  Despite having tachycardia in the 160s and 180s, he denies any chest pain or pressure while he was in the hospital.  He was little short of breath, but that was also because he had URI symptoms.  He otherwise denies exertional chest pain or pressure.  No exertional dyspnea.  No PND orthopnea.  CV Review of Symptoms (Summary) Cardiovascular ROS: no chest pain or dyspnea on exertion positive for - off & on palpitations that are self-limiting.  No prolonged episodes.  He self acknowledges that he has become sedentary. negative for - edema, orthopnea, paroxysmal nocturnal dyspnea, shortness of breath, or no  recurrent rapid irregular heartbeat/palpitations.  Syncope/near syncope or TIA symptoms.  REVIEWED OF SYSTEMS   Review of Systems  Constitutional:  Negative for chills, fever, malaise/fatigue and weight loss.       Pretty much completely recovered from [place]:  Negative for ear discharge and nosebleeds.   Respiratory:         URI symptoms have resolved.  Cardiovascular:  Negative for chest pain and leg swelling.  Gastrointestinal:  Negative for blood in stool and melena.  Genitourinary:  Negative for hematuria.  Musculoskeletal:  Negative for joint pain.  Neurological:  Positive for dizziness (When he went to the hospital, not otherwise). Negative for focal weakness and seizures.  Psychiatric/Behavioral: Negative.  Negative for depression and memory loss. The patient is not nervous/anxious and does not have insomnia.    I have reviewed and (if needed) personally updated the patient's problem list, medications, allergies, past medical and surgical history, social and family history.   PAST MEDICAL HISTORY   Past Medical History:  Diagnosis Date   Kidney stone    Dec '16   New onset atrial fibrillation ([place]) 11/12/2020    PAST SURGICAL HISTORY   Past Surgical History:  Procedure Laterality Date   TRANSTHORACIC ECHOCARDIOGRAM  11/13/2020   Normal LVEF of 55 to 60%.   Mildly dilated LV with mild LVH.  Normal RV.  Normal valves.  Normal RAP.   WISDOM TOOTH EXTRACTION  1996     There is no immunization history on file for this patient.  MEDICATIONS/ALLERGIES   Current Meds  Medication Sig   aspirin EC 81 MG tablet Take 1 tablet (81 mg total) by mouth 4 (four) times a week.   diltiazem (CARDIZEM) 60 MG tablet Take 1 tablet (60 mg total) by mouth every 6 (six) hours as needed.    No Known Allergies  SOCIAL HISTORY/FAMILY HISTORY   Reviewed in Epic:  Pertinent findings:  Social History   Tobacco Use   Smoking status: Never   Smokeless tobacco: Never  Vaping Use   Vaping Use: Never used  Substance Use Topics   Alcohol use: No    Comment: 2 timse per year   Drug use: Never   Social History   Social History Narrative  Married father of 2 (age is 47 and 27 i (01/18/2019      Trevino has 2 Masters Location manager in Conservation officer, nature, Systems analyst.  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      He routinely exercises about 3 days a week for 30 minutes at a time doing combination of walking and light weights.    OBJCTIVE -PE, EKG, labs   Wt Readings from Last 3 Encounters:  01/13/21 243 lb 12.8 oz (110.6 kg)  11/13/20 247 lb 12.8 oz (112.4 kg)  08/11/20 250 lb (113.4 kg)    Physical Exam: BP 126/78   Pulse 64   Ht '5\' 10"'$  (1.778 m)   Wt 243 lb 12.8 oz (110.6 kg)   SpO2 95%   BMI 34.98 kg/m  Physical Exam Vitals reviewed.  Constitutional:      General: He is not in acute distress.    Appearance: Normal appearance. He is obese. He is not ill-appearing or toxic-appearing.  HENT:     Head: Normocephalic and atraumatic.  Neck:     Vascular: No carotid bruit, hepatojugular reflux or JVD.  Cardiovascular:     Rate and Rhythm: Normal rate and regular rhythm. No extrasystoles are present.    Chest Wall: PMI is not displaced.     Pulses: Normal pulses.     Heart sounds: Normal heart sounds, S1 normal and S2 normal. No murmur  heard.   No friction rub. No gallop.  Pulmonary:     Effort: Pulmonary effort is normal. No respiratory distress.     Breath sounds: No stridor. No wheezing, rhonchi or rales.  Chest:     Chest wall: No tenderness.  Musculoskeletal:        General: No swelling. Normal range of motion.     Cervical back: Normal range of motion and neck supple.  Skin:    General: Skin is warm and dry.  Neurological:     General: No focal deficit present.     Mental Status: He is alert and oriented to person, place, and time.     Motor: No weakness.     Gait: Gait normal.     Adult ECG Report  Rate: 64 ;  Rhythm: normal sinus rhythm and normal axis, intervals and durations ;   Narrative Interpretation: Normal  Recent Labs:   06/01/2020: TC 161, TG 188, HDL 36, LDL 87 No results found for: CHOL, HDL, LDLCALC, LDLDIRECT, TRIG, CHOLHDL Lab Results  Component Value Date   CREATININE 0.94 11/13/2020   BUN 10 11/13/2020   NA 140 11/13/2020   K 3.8 11/13/2020   CL 112 (H) 11/13/2020   CO2 23 11/13/2020   CBC Latest Ref Rng & Units 11/13/2020 11/12/2020 05/29/2015  WBC 4.0 - 10.5 K/uL 5.1 4.8 11.9(H)  Hemoglobin 13.0 - 17.0 g/dL 14.2 15.4 14.7  Hematocrit 39.0 - 52.0 % 40.5 45.2 43.0  Platelets 150 - 400 K/uL 189 223 272    Lab Results  Component Value Date   TSH 2.155 11/13/2020    ==================================================  COVID-19 Education: The signs and symptoms of COVID-19 were discussed with the patient and how to seek care for testing (follow up with PCP or arrange E-visit).    I spent a total of 16mnutes with the patient spent in direct patient consultation.  Additional time spent with chart review  / charting (studies, outside notes, etc):  18 min Total Time: 51 min  Current medicines are reviewed at length with the patient today.  (+/-  concerns) n/a  This visit occurred during the SARS-CoV-2 public health emergency.  Safety protocols were in place, including screening  questions prior to the visit, additional usage of staff PPE, and extensive cleaning of exam room while observing appropriate contact time as indicated for disinfecting solutions.  Notice: This dictation was prepared with Dragon dictation along with smaller phrase technology. Any transcriptional errors that result from this process are unintentional and may not be corrected upon review.  Patient Instructions / Medication Changes & Studies & Tests Ordered   Patient Instructions  Medication Instructions:  Recommend taking Aspirin 81 mg 3 to 4 times a week   If you have an episode of atrial fibrillation follow these instructions Diltiazem 60 mg  up to every 6 hours. Also take 4 baby aspirin ( chewable and chew them. If you feel overly symptomatic  go to hospital/   *If you need a refill on your cardiac medications before your next appointment, please call your pharmacy*   Lab Work: Not needed  If you have labs (blood work) drawn today and your tests are completely normal, you will receive your results only by: [place] (if you have MyChart) OR A paper copy in the mail If you have any lab test that is abnormal or we need to change your treatment, we will call you to review the results.   Testing/Procedures:  Not needed  Follow-Up: At [facility], you and your health needs are our priority.  As part of our continuing mission to provide you with exceptional heart care, we have created designated Provider Care Teams.  These Care Teams include your primary Cardiologist (physician) and Advanced Practice Providers (APPs -  Physician Assistants and Nurse Practitioners) who all work together to provide you with the care you need, when you need it.     Your next appointment:   12 month(s)  The format for your next appointment:   In Person  Provider:   Glenetta Hew, MD   Other Instructions    Studies Ordered:   Orders Placed This Encounter  Procedures   EKG 12-Lead       Glenetta Hew, M.D., M.S. Interventional Cardiologist   Pager # [phone] Phone # [phone] [address] [place], [place] [address]   Thank you for choosing Heartcare at [facility]!!

## 2021-01-13 NOTE — Patient Instructions (Addendum)
Medication Instructions:  Recommend taking Aspirin 81 mg 3 to 4 times a week   If you have an episode of atrial fibrillation follow these instructions Diltiazem 60 mg  up to every 6 hours. Also take 4 baby aspirin ( chewable and chew them. If you feel overly symptomatic  go to hospital/   *If you need a refill on your cardiac medications before your next appointment, please call your pharmacy*   Lab Work: Not needed  If you have labs (blood work) drawn today and your tests are completely normal, you will receive your results only by: [place] (if you have MyChart) OR A paper copy in the mail If you have any lab test that is abnormal or we need to change your treatment, we will call you to review the results.   Testing/Procedures:  Not needed  Follow-Up: At [facility], you and your health needs are our priority.  As part of our continuing mission to provide you with exceptional heart care, we have created designated Provider Care Teams.  These Care Teams include your primary Cardiologist (physician) and Advanced Practice Providers (APPs -  Physician Assistants and Nurse Practitioners) who all work together to provide you with the care you need, when you need it.     Your next appointment:   12 month(s)  The format for your next appointment:   In Person  Provider:   Glenetta Hew, MD   Other Instructions

## 2021-01-18 ENCOUNTER — Encounter: Payer: Self-pay | Admitting: Cardiology

## 2021-01-18 NOTE — Assessment & Plan Note (Addendum)
He had probably more atrial fibrillation.  Atrial flutter will be consistent with a heart rate of 150 but not 180 or 160.  Also atrial fibrillation is probably more likely to have occurred in the setting of Sudafed.    Most likely triggered incident with combination of acute illness and Sudafed, however he may still have the substrate now going forward.  This patients CHA2DS2-VASc Score and unadjusted Ischemic Stroke Rate (% per year) is equal to 0.2 % stroke rate/year from a score of 0  Above score calculated as 1 point each if present [CHF, HTN, DM, Vascular=MI/PAD/Aortic Plaque, Age if 65-74, or Male] Above score calculated as 2 points each if present [Age > 75, or Stroke/TIA/TE]  Plan:  With a very low CHA2DS2-VASc score, I think he is probably fine not being on a DOAC.  Recommend 81 mg aspirin 3 to 4 days a week.  If he does have episode of breakthrough tachycardia/irregular heartbeat episode, recommend taking 4 baby aspirin.  Diltiazem 60 mg p.o. PRN tachycardia.  May take up to every 6 hours for breakthrough spells of A. Fib.  If he were to have a break episode that is not triggered, would then need to potentially consider other etiologies look into stress testing.

## 2021-01-18 NOTE — Assessment & Plan Note (Signed)
Positive troponin was probably Demand Ischemia in the setting of A. fib RVR and acute illness.  Echo was normal.

## 2022-02-17 ENCOUNTER — Telehealth: Payer: Self-pay | Admitting: Cardiology

## 2022-02-17 MED ORDER — DILTIAZEM HCL 60 MG PO TABS: 60.0000 mg | ORAL_TABLET | Freq: Four times a day (QID) | ORAL | 2 refills | Status: DC | PRN

## 2022-02-17 NOTE — Telephone Encounter (Signed)
*  STAT* If patient is at the pharmacy, call can be transferred to refill team.   1. Which medications need to be refilled? (please list name of each medication and dose if known) diltiazem (CARDIZEM) 60 MG tablet  2. Which pharmacy/location (including street and city if local pharmacy) is medication to be sent to? [place], [place]  3. Do they need a 30 day or 90 day supply? [place]

## 2022-02-17 NOTE — Telephone Encounter (Signed)
Spoke with pt's wife, Sharrie Rothman (ok per [facility]) regarding prescription. Didn't see that pt has established an appointment with PA, but wife asks for a sooner appointment if available. Prescription refill sent to pharmacy of choice. Able to schedule pt with a sooner appointment with Almyra Deforest, PA. Wife verbalizes understanding.

## 2022-02-22 ENCOUNTER — Ambulatory Visit: Payer: Commercial Managed Care - HMO | Attending: Physician Assistant | Admitting: Physician Assistant

## 2022-02-22 ENCOUNTER — Encounter: Payer: Self-pay | Admitting: Physician Assistant

## 2022-02-22 VITALS — BP 112/72 | HR 71 | Ht 70.0 in | Wt 234.8 lb

## 2022-02-22 DIAGNOSIS — I480 Paroxysmal atrial fibrillation: Secondary | ICD-10-CM

## 2022-02-22 NOTE — Progress Notes (Signed)
Cardiology Office Note:    Date:  02/23/2022   ID:  Caleb Cannon, DOB 11/14/1970, MRN [id number]  PCP:  Haywood Pao, MD   [place] Prov[facility] Cardiologist:  Glenetta Hew, MD     Referring MD: Haywood Pao, MD   Chief Complaint  Patient presents with   Follow-up    Seen for Dr. Ellyn Hack    History of Present Illness:    Caleb Cannon is a 51 y.o. male with a hx of PAF.  Patient was diagnosed with A-fib with RVR in the setting of COVID infection in May 2022.  He was taking OTC decongestant and Sudafed.  She was found to be in rapid A-fib/atrial flutter with heart rate of 150 bpm.  Heart rate on arrival was 180s.  She was treated with IV Cardizem which converted him back to normal rhythm.  TSH and electrolyte were normal.  Echocardiogram obtained on 11/05/2020 showed EF 55 to 60%, mildly dilated LV with mild LVH, normal RV, normal heart valves.  Given low CHA2DS2-Vasc, he was not placed on anticoagulation therapy, use that he was placed on low-dose aspirin.  Patient presents today for follow-up.  He has been in his usual state of health until about 1.5 weeks ago.  He has been having increased palpitation recently.  Interestingly, he denies any racing heartbeat, however he felt his heartbeat is abnormal.  He has a [employer], we have reviewed recent strips which all showed a normal sinus rhythm.  I decided to hold off on obtaining a heart monitor for now, I recommended continue to use the [employer] smart watch to monitor for irregular rhythm.  He will continue to take as needed dose of diltiazem for palpitation.  If palpitations increase in frequency or duration, he has been instructed to contact cardiology service, then we can consider ordering a heart 2-week heart monitor.  Otherwise, he has no recent chest pain or worsening dyspnea.  Past Medical History:  Diagnosis Date   Kidney stone    Dec '16   New onset atrial fibrillation ([place]) 11/12/2020    Past Surgical  History:  Procedure Laterality Date   TRANSTHORACIC ECHOCARDIOGRAM  11/13/2020   Normal LVEF of 55 to 60%.  Mildly dilated LV with mild LVH.  Normal RV.  Normal valves.  Normal RAP.   WISDOM TOOTH EXTRACTION  1996    Current Medications: Current Meds  Medication Sig   aspirin EC 81 MG tablet Take 1 tablet (81 mg total) by mouth 4 (four) times a week.   diltiazem (CARDIZEM) 60 MG tablet Take 1 tablet (60 mg total) by mouth every 6 (six) hours as needed.     Allergies:   Patient has no known allergies.   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      He routinely exercises about 3 days a week for 30 minutes at a time doing combination of walking and light weights.   Social Determinants of Health   Financial Resource Strain: Not on file  Food Insecurity: Not on file  Transportation Needs: Not on file  Physical Activity: Not on file  Stress: Not on file  Social Connections: Not on file     Family History: The patient's family history includes Diabetes in his sister; Diabetes Mellitus II in his mother; Heart attack in his maternal grandfather and paternal grandfather. There is no history of Colon polyps,  Colon cancer, Esophageal cancer, Rectal cancer, or Stomach cancer.  ROS:   Please see the history of present illness.     All other systems reviewed and are negative.  EKGs/Labs/Other Studies Reviewed:    The following studies were reviewed today:  Echo 11/13/2020  1. Limited study in Covid positive patient; not all views obtained.   2. Left ventricular ejection fraction, by estimation, is 55 to 60%. The  left ventricle has normal function. The left ventricular internal cavity  size was mildly dilated. There is mild left ventricular hypertrophy.   3. Right ventricular systolic function is normal. The right ventricular  size is normal.   4. The mitral valve is normal in structure. No evidence of mitral valve  regurgitation.   5. The aortic valve is tricuspid. Aortic valve regurgitation is not  visualized.   6. The inferior vena cava is normal in size with greater than 50%  respiratory variability, suggesting right atrial pressure of 3 mmHg.   EKG:  EKG is ordered today.  The ekg ordered today demonstrates normal sinus rhythm, no significant ST-T wave changes.  Recent Labs: No results found for requested labs within last 365 days.  Recent Lipid Panel No results found for: "CHOL", "TRIG", "HDL", "CHOLHDL", "VLDL", "LDLCALC", "LDLDIRECT"   Risk Assessment/Calculations:    CHA2DS2-VASc Score = 0   This indicates a 0.2% annual risk of stroke. The patient's score is based upon: CHF History: 0 HTN History: 0 Diabetes History: 0 Stroke History: 0 Vascular Disease History: 0 Age Score: 0 Gender Score: 0           Physical Exam:    VS:  BP 112/72   Pulse 71   Ht '5\' 10"'$  (1.778 m)   Wt 234 lb 12.8 oz (106.5 kg)   SpO2 99%   BMI 33.69 kg/m         Wt Readings from Last 3 Encounters:  02/22/22 234 lb 12.8 oz (106.5 kg)  01/13/21 243 lb 12.8 oz (110.6 kg)  11/13/20 247 lb 12.8 oz (112.4 kg)     GEN:  Well nourished, well developed in no acute distress HEENT:  Normal NECK: No JVD; No carotid bruits LYMPHATICS: No lymphadenopathy CARDIAC: RRR, no murmurs, rubs, gallops RESPIRATORY:  Clear to auscultation without rales, wheezing or rhonchi  ABDOMEN: Soft, non-tender, non-distended MUSCULOSKELETAL:  No edema; No deformity  SKIN: Warm and dry NEUROLOGIC:  Alert and oriented x 3 PSYCHIATRIC:  Normal affect   ASSESSMENT:    1. PAF (paroxysmal atrial fibrillation) (HCC)    PLAN:    In order of problems listed above:  Paroxysmal atrial fibrillation: Previous episode of  paroxysmal atrial fibrillation occurred during COVID infection and the use of Sudafed decongestant.  He has not had any recurrence that is documented on EKG since.  In the past 1.5 weeks, he has been having increased fluttering sensation.  However the strips obtained by his Samsung watch all showed sinus rhythm.  I recommended continue as needed dose of diltiazem.  If palpitation increase in frequency, we can order a 2-week heart monitor           Medication Adjustments/Labs and Tests Ordered: Current medicines are reviewed at length with the patient today.  Concerns regarding medicines are outlined above.  Orders Placed This Encounter  Procedures   EKG 12-Lead   No orders of the defined types were placed in this encounter.   Patient Instructions  Medication Instructions:  CONTINUE as needed Diltiazem for palpitations   Your physician recommends that you continue on your current medications as directed. Please refer to the Current Medication list given to you today.  *If you need a refill on your cardiac medications before your next appointment, please call your pharmacy*  Lab Work: NONE ordered at this time of appointment   If you have labs (blood work) drawn today and your tests are completely normal, you will receive your results only by: [place] (if you have MyChart) OR A paper copy in the mail If you have any lab test that is abnormal or we need to  change your treatment, we will call you to review the results.  Testing/Procedures: NONE ordered at this time of appointment   Follow-Up: At [facility], you and your health needs are our priority.  As part of our continuing mission to provide you with exceptional heart care, we have created designated Provider Care Teams.  These Care Teams include your primary Cardiologist (physician) and Advanced Practice Providers (APPs -  Physician Assistants and Nurse Practitioners) who all work together to provide you with the care you need, when you need it.   Your next appointment:   1 year(s)  The format for your next appointment:   In Person  Provider:   Glenetta Hew, MD     Other Instructions If palpitations continue to worsen give office a call.   Important Information About Sugar         Hilbert Corrigan, [place]  02/23/2022 11:34 PM    [place]

## 2022-02-22 NOTE — Patient Instructions (Addendum)
Medication Instructions:  CONTINUE as needed Diltiazem for palpitations   Your physician recommends that you continue on your current medications as directed. Please refer to the Current Medication list given to you today.  *If you need a refill on your cardiac medications before your next appointment, please call your pharmacy*  Lab Work: NONE ordered at this time of appointment   If you have labs (blood work) drawn today and your tests are completely normal, you will receive your results only by: [place] (if you have MyChart) OR A paper copy in the mail If you have any lab test that is abnormal or we need to change your treatment, we will call you to review the results.  Testing/Procedures: NONE ordered at this time of appointment   Follow-Up: At [facility], you and your health needs are our priority.  As part of our continuing mission to provide you with exceptional heart care, we have created designated Provider Care Teams.  These Care Teams include your primary Cardiologist (physician) and Advanced Practice Providers (APPs -  Physician Assistants and Nurse Practitioners) who all work together to provide you with the care you need, when you need it.   Your next appointment:   1 year(s)  The format for your next appointment:   In Person  Provider:   Glenetta Hew, MD     Other Instructions If palpitations continue to worsen give office a call.   Important Information About Sugar

## 2022-02-23 ENCOUNTER — Encounter: Payer: Self-pay | Admitting: Physician Assistant

## 2022-03-15 ENCOUNTER — Ambulatory Visit: Payer: Self-pay | Admitting: Physician Assistant

## 2022-06-20 ENCOUNTER — Other Ambulatory Visit: Payer: Self-pay | Admitting: Cardiology

## 2022-07-05 DIAGNOSIS — Z1212 Encounter for screening for malignant neoplasm of rectum: Secondary | ICD-10-CM | POA: Diagnosis not present

## 2022-07-12 DIAGNOSIS — R82998 Other abnormal findings in urine: Secondary | ICD-10-CM | POA: Diagnosis not present

## 2023-06-22 ENCOUNTER — Encounter: Payer: Self-pay | Admitting: Cardiology

## 2023-06-24 NOTE — Telephone Encounter (Signed)
 Can consider Afib Clinic Appt if maybe sooner.  In-person better  Would be nice to have a monitor.

## 2023-06-25 DIAGNOSIS — Z125 Encounter for screening for malignant neoplasm of prostate: Secondary | ICD-10-CM | POA: Diagnosis not present

## 2023-06-25 DIAGNOSIS — I480 Paroxysmal atrial fibrillation: Secondary | ICD-10-CM | POA: Diagnosis not present

## 2023-06-25 DIAGNOSIS — Z0000 Encounter for general adult medical examination without abnormal findings: Secondary | ICD-10-CM | POA: Diagnosis not present

## 2023-06-25 LAB — LAB REPORT - SCANNED: EGFR: 63.3

## 2023-07-10 NOTE — Progress Notes (Signed)
Cardiology Clinic Note   Patient Name: Nole Robey Date of Encounter: 07/13/2023  Primary Care Provider:  Gaspar Garbe, MD Primary Cardiologist:  Bryan Lemma, MD  Patient Profile    Ketih Goodie 53 year old male presents to the clinic today for review of his atrial fibrillation.  Past Medical History    Past Medical History:  Diagnosis Date   Kidney stone    Dec '16   New onset atrial fibrillation (HCC) 11/12/2020   Past Surgical History:  Procedure Laterality Date   TRANSTHORACIC ECHOCARDIOGRAM  11/13/2020   Normal LVEF of 55 to 60%.  Mildly dilated LV with mild LVH.  Normal RV.  Normal valves.  Normal RAP.   WISDOM TOOTH EXTRACTION  1996    Allergies  No Known Allergies  History of Present Illness    Joanne Brander has a history of paroxysmal atrial fibrillation.  He was diagnosed with A-fib RVR in the setting of COVID infection on 5/22.  He was taking over-the-counter decongestants and Sudafed.  His rate was noted to be 150 bpm.  His heart rate on arrival was in the 180s.  He was treated with IV Cardizem and converted back to normal sinus rhythm.  His TSH and electrolytes were normal.  He underwent echocardiogram 11/05/2020 which showed LVEF of 55-60%, mildly dilated LV, mild LVH, and no valvular abnormalities.  Due to his low CHA2DS2-VASc score he was not placed on anticoagulation therapy.  He was placed on low-dose aspirin.  He was seen in follow-up by Azalee Course PA-C on 02/22/2022.  He reported that he had been in his usual state of health until about 1-1/2 weeks prior.  He noted increased palpitations.  He denied heart racing.  He did note abnormal heartbeat.  He was monitoring his pulse with his watch.  His rhythm strips were reviewed which showed sinus rhythm.  Cardiac event monitor was deferred.  He denied chest pain, worsening dyspnea.  He sent a MyChart message on 06/22/2023.  He reported irregular heart rates in the 63-105 bpm range.  Option for scheduling with  A-fib clinic was discussed.  He was added to my schedule today.  He presents to the clinic today for follow-up evaluation and states he had January 2 January 3 elevated heart rate in the 130s.  He presented to his PCP and was placed on long-acting diltiazem.  On the fourth fifth and sixth his heart rate slowly returned to normal.  He was also placed on Eliquis.  We reviewed his previous atrial fibrillation episode.  He expressed understanding.  It appears that his atrial fibrillation may be triggered by apneic episodes.  I recommended evaluation for OSA.  We reviewed his CHA2DS2-VASc scoring.  His score is 0.  I will have him continue short acting diltiazem for palpitations and plan follow-up after sleep study..  Today he denies chest pain, shortness of breath, lower extremity edema, fatigue, palpitations, melena, hematuria, hemoptysis, diaphoresis, weakness, presyncope, syncope, orthopnea, and PND.     Home Medications    Prior to Admission medications   Medication Sig Start Date End Date Taking? Authorizing Provider  aspirin EC 81 MG tablet Take 1 tablet (81 mg total) by mouth 4 (four) times a week. 01/13/21   Marykay Lex, MD  diltiazem (CARDIZEM) 60 MG tablet Take 1 tablet by mouth every 6 hours as needed. 06/20/22   Marykay Lex, MD    Family History    Family History  Problem Relation Age of Onset  Diabetes Mellitus II Mother    Diabetes Sister    Heart attack Maternal Grandfather    Heart attack Paternal Grandfather    Colon polyps Neg Hx    Colon cancer Neg Hx    Esophageal cancer Neg Hx    Rectal cancer Neg Hx    Stomach cancer Neg Hx    He indicated that his mother is alive. He indicated that his father is alive. He indicated that his sister is alive. He indicated that his maternal grandmother is deceased. He indicated that his maternal grandfather is deceased. He indicated that his paternal grandmother is deceased. He indicated that his paternal grandfather is  deceased. He indicated that the status of his neg hx is unknown.  Social History    Social History   Socioeconomic History   Marital status: Married    Spouse name: Not on file   Number of children: 2   Years of education: Not on file   Highest education level: Master's degree (e.g., MA, MS, MEng, MEd, MSW, MBA)  Occupational History   Occupation: Scientist, research (physical sciences): Production assistant, radio FOR SELF EMPLOYED  Tobacco Use   Smoking status: Never   Smokeless tobacco: Never  Vaping Use   Vaping status: Never Used  Substance and Sexual Activity   Alcohol use: No    Comment: 2 timse per year   Drug use: Never   Sexual activity: Yes    Partners: Male  Other Topics Concern   Not on file  Social History Narrative   Married father of 2 (age is 60 and 3 i (01/18/2019      Deniel has 2 Masters Camera operator in Nurse, children's, and Designer, fashion/clothing.  He is self-employed doing Catering manager for [employer].      He routinely exercises about 3 days a week for 30 minutes at a time doing combination of walking and light weights.   Social Drivers of Corporate investment banker Strain: Not on file  Food Insecurity: Not on file  Transportation Needs: Not on file  Physical Activity: Not on file  Stress: Not on file  Social Connections: Not on file  Intimate Partner Violence: Not on file     Review of Systems    General:  No chills, fever, night sweats or weight changes.  Cardiovascular:  No chest pain, dyspnea on exertion, edema, orthopnea, palpitations, paroxysmal nocturnal dyspnea. Dermatological: No rash, lesions/masses Respiratory: No cough, dyspnea Urologic: No hematuria, dysuria Abdominal:   No nausea, vomiting, diarrhea, bright red blood per rectum, melena, or hematemesis Neurologic:  No visual changes, wkns, changes in mental status. All other systems reviewed and are otherwise negative except as noted above.  Physical Exam    VS:  BP 106/68   Pulse (!) 56   Ht 5\' 10"  (1.778  m)   Wt 241 lb (109.3 kg)   SpO2 97%   BMI 34.58 kg/m  , BMI Body mass index is 34.58 kg/m. GEN: Well nourished, well developed, in no acute distress. HEENT: normal. Neck: Supple, no JVD, carotid bruits, or masses. Cardiac: RRR, no murmurs, rubs, or gallops. No clubbing, cyanosis, edema.  Radials/DP/PT 2+ and equal bilaterally.  Respiratory:  Respirations regular and unlabored, clear to auscultation bilaterally. GI: Soft, nontender, nondistended, BS + x 4. MS: no deformity or atrophy. Skin: warm and dry, no rash. Neuro:  Strength and sensation are intact. Psych: Normal affect.  Accessory Clinical Findings    Recent Labs: No results found for requested labs  within last 365 days.   Recent Lipid Panel No results found for: "CHOL", "TRIG", "HDL", "CHOLHDL", "VLDL", "LDLCALC", "LDLDIRECT"       ECG personally reviewed by me today- EKG Interpretation Date/Time:  Friday July 13 2023 15:34:02 EST Ventricular Rate:  56 PR Interval:  176 QRS Duration:  104 QT Interval:  434 QTC Calculation: 418 R Axis:   -13  Text Interpretation: Sinus bradycardia When compared with ECG of 12-Nov-2020 16:19, PREVIOUS ECG IS PRESENT Confirmed by Edd Fabian [phone]) on 07/13/2023 4:11:31 PM   Echocardiogram limited 11/13/2020  IMPRESSIONS     1. Limited study in Covid positive patient; not all views obtained.   2. Left ventricular ejection fraction, by estimation, is 55 to 60%. The  left ventricle has normal function. The left ventricular internal cavity  size was mildly dilated. There is mild left ventricular hypertrophy.   3. Right ventricular systolic function is normal. The right ventricular  size is normal.   4. The mitral valve is normal in structure. No evidence of mitral valve  regurgitation.   5. The aortic valve is tricuspid. Aortic valve regurgitation is not  visualized.   6. The inferior vena cava is normal in size with greater than 50%  respiratory variability, suggesting  right atrial pressure of 3 mmHg.   FINDINGS   Left Ventricle: Left ventricular ejection fraction, by estimation, is 55  to 60%. The left ventricle has normal function. The left ventricular  internal cavity size was mildly dilated. There is mild left ventricular  hypertrophy.   Right Ventricle: The right ventricular size is normal. Right ventricular  systolic function is normal.   Left Atrium: Left atrial size was normal in size.   Right Atrium: Right atrial size was normal in size.   Pericardium: There is no evidence of pericardial effusion.   Mitral Valve: The mitral valve is normal in structure.   Tricuspid Valve: The tricuspid valve is normal in structure. Tricuspid  valve regurgitation is trivial.   Aortic Valve: The aortic valve is tricuspid. Aortic valve regurgitation is  not visualized.   Pulmonic Valve: The pulmonic valve was not well visualized.   Aorta: The aortic root is normal in size and structure.   Venous: The inferior vena cava is normal in size with greater than 50%  respiratory variability, suggesting right atrial pressure of 3 mmHg.   Additional Comments: Limited study in Covid positive patient; not all  views obtained.  Olga Millers MD  Electronically signed by Olga Millers MD  Signature Date/Time: 11/13/2020/4:31:08 PM        Final       Assessment & Plan   1.  Paroxysmal atrial fibrillation-EKG today shows sinus bradycardia 56 bpm.  Reports increased irregular episodes of irregular heartbeat.  Continues to monitor heartbeat with his wrist device.   Avoid triggers caffeine, chocolate, EtOH, dehydration etc.  Previously not started on anticoagulation due to low CHA2DS2-VASc scoring.This patients CHA2DS2-VASc Score and unadjusted Ischemic Stroke Rate (% per year) is equal to 0.2 % stroke rate/year from a score of 0.  You shared decision making to discuss option for home sleep study.  Will await call to decide whether to proceed with sleep  study STOP-BANG score 6 Continue diltiazem, aspirin Maintain p.o. hydration Avoid trigger for palpitations Continue diltiazem 60 mg as needed  Disposition: Follow-up with Dr. Herbie Baltimore or me after sleep study   Thomasene Ripple. Keileigh Vahey NP-C     07/13/2023, 4:11 PM  [facility] [address] [employer] [phone] Fax [phone]    I spent 15 minutes examining this patient, reviewing medications, and using patient centered shared decision making involving their cardiac care.   I spent greater than 20 minutes reviewing their past medical history,  medications, and prior cardiac tests.

## 2023-07-13 ENCOUNTER — Ambulatory Visit: Payer: 59 | Attending: General Practice | Admitting: General Practice

## 2023-07-13 ENCOUNTER — Encounter: Payer: Self-pay | Admitting: General Practice

## 2023-07-13 VITALS — BP 106/68 | HR 56 | Ht 70.0 in | Wt 241.0 lb

## 2023-07-13 DIAGNOSIS — I480 Paroxysmal atrial fibrillation: Secondary | ICD-10-CM

## 2023-07-13 MED ORDER — DILTIAZEM HCL 60 MG PO TABS: 60.0000 mg | ORAL_TABLET | ORAL | Status: AC | PRN

## 2023-07-13 NOTE — Patient Instructions (Signed)
Medication Instructions:  TAKE YOUR DILTIAZEM 60MG  AS NEEDED FOR PALPITATIONS *If you need a refill on your cardiac medications before your next appointment, please call your pharmacy*  Lab Work: NONE  Other Instructions CALL AND LET us KNOW WHEN YOU WANT TO DO THE SLEEP MONITOR  Follow-Up: At [facility], you and your health needs are our priority.  As part of our continuing mission to provide you with exceptional heart care, we have created designated Provider Care Teams.  These Care Teams include your primary Cardiologist (physician) and Advanced Practice Providers (APPs -  Physician Assistants and Nurse Practitioners) who all work together to provide you with the care you need, when you need it.  Your next appointment:   AFTER SLEEP MONITOR   Provider:   Bryan Lemma, MD  or Edd Fabian, FNP

## 2023-07-16 NOTE — Addendum Note (Signed)
Addended by: Alyson Ingles on: 07/16/2023 08:24 AM   Modules accepted: Orders

## 2023-07-19 DIAGNOSIS — R0683 Snoring: Secondary | ICD-10-CM | POA: Diagnosis not present

## 2023-07-19 DIAGNOSIS — R82998 Other abnormal findings in urine: Secondary | ICD-10-CM | POA: Diagnosis not present

## 2023-07-19 DIAGNOSIS — R0789 Other chest pain: Secondary | ICD-10-CM | POA: Diagnosis not present

## 2023-07-19 DIAGNOSIS — K635 Polyp of colon: Secondary | ICD-10-CM | POA: Diagnosis not present

## 2023-07-19 DIAGNOSIS — B351 Tinea unguium: Secondary | ICD-10-CM | POA: Diagnosis not present

## 2023-07-19 DIAGNOSIS — Z1339 Encounter for screening examination for other mental health and behavioral disorders: Secondary | ICD-10-CM | POA: Diagnosis not present

## 2023-07-19 DIAGNOSIS — I4891 Unspecified atrial fibrillation: Secondary | ICD-10-CM | POA: Diagnosis not present

## 2023-07-19 DIAGNOSIS — E786 Lipoprotein deficiency: Secondary | ICD-10-CM | POA: Diagnosis not present

## 2023-07-19 DIAGNOSIS — L729 Follicular cyst of the skin and subcutaneous tissue, unspecified: Secondary | ICD-10-CM | POA: Diagnosis not present

## 2023-07-19 DIAGNOSIS — Z1331 Encounter for screening for depression: Secondary | ICD-10-CM | POA: Diagnosis not present

## 2023-07-19 DIAGNOSIS — Z0000 Encounter for general adult medical examination without abnormal findings: Secondary | ICD-10-CM | POA: Diagnosis not present

## 2023-07-19 DIAGNOSIS — E669 Obesity, unspecified: Secondary | ICD-10-CM | POA: Diagnosis not present

## 2023-09-24 ENCOUNTER — Telehealth: Payer: Self-pay

## 2023-09-24 NOTE — Telephone Encounter (Signed)
**Note De-Identified Pax Reasoner Obfuscation** Ordering provider: Edd Fabian, NP Associated diagnoses: Somnolence-R40.0 and A-fib-I48.0 WatchPAT PA obtained on 09/24/2023 by Caleb Cannon, Caleb Formosa, LPN. Authorization: Per Medical illustrator, a PA is not required for this home sleep study. Reference#: [id number]  Patient notified of PIN (1234) on 09/24/2023 Caleb Cannon Notification Method: phone. Per the pt he was not given a WatchPAT One-HST Device while in the offce. He is aware that someone from the office will be contacting him to set up a time for him to come by the office to get a WatchPAT One-HST Device.  Phone note routed to covering staff for follow-up.

## 2023-09-25 NOTE — Telephone Encounter (Signed)
 Discussed with sleep manager-she will call and discuss with pt. Paperwork filled out and given to her.

## 2024-05-12 NOTE — Telephone Encounter (Signed)
**Note De-Identified Caleb Cannon Obfuscation** The pt states that he plans to come by the office on Wednesday 11/26 before 3:30 to pick up a WatchPAT One-HST Device with instructions.

## 2024-05-14 NOTE — Telephone Encounter (Signed)
**Note De-Identified Caleb Cannon Obfuscation** The pt and his wife came to the office to pick up a WatchPAT One-HST device.  When we attempted to download the app on his cell phone he got a message that the device is not compatible with his cell phone version. We then tried to download the app on his wife's phone but got the same message.  I offered to do a PA for a home sleep test type 3 that he can pick up at the sleep lab but he declined and stated that he and his wife have been aware that their phones are old and have been meaning to replace them.  He requested that we hold on to the device and that he will get a new phone and plans to come back to the office on Monday (05/19/24) to pick up a WatchPAT one device with instructions on how to use it.  I advised him that would be fine as I will be here on Monday. He thanked me for my assistance.  I have placed his device and paperwork back in the cabinet. I have already initialized the device in the Cloud[employer] portal.

## 2024-05-21 ENCOUNTER — Encounter: Payer: Self-pay | Admitting: Cardiology

## 2024-05-21 DIAGNOSIS — R0683 Snoring: Secondary | ICD-10-CM | POA: Diagnosis not present

## 2024-05-21 NOTE — Telephone Encounter (Addendum)
 Patient came to the office today to pick up his HST. Paperwork signed.

## 2024-05-23 ENCOUNTER — Ambulatory Visit: Attending: General Practice

## 2024-05-23 NOTE — Procedures (Signed)
    SLEEP STUDY REPORT Patient Information Study Date: 05/21/2024 Patient Name: Caleb Cannon Patient ID: [id number] Birth Date: [birth year] Age: 53 Gender: Male BMI: 34.4 (W=240 lb, H=5' 10'') Referring Physician: Josefa Beauvais, NP  TEST DESCRIPTION: Home sleep apnea testing was completed using the WatchPat, a Type 1 device, utilizing peripheral arterial tonometry (PAT), chest movement, actigraphy, pulse oximetry, pulse rate, body position and snore. AHI was calculated with apnea and hypopnea using valid sleep time as the denominator. RDI includes apneas, hypopneas, and RERAs. The data acquired and the scoring of sleep and all associated events were performed in accordance with the recommended standards and specifications as outlined in the AASM Manual for the Scoring of Sleep and Associated Events 2.2.0 (2015).  FINDINGS: 1. No evidence of Obstructive Sleep Apnea with AHI 2.8/hr. 2. No Central Sleep Apnea. 3. Oxygen desaturations as low as 89%. 4. Mild to moderate snoring was present. O2 sats were < 88% for minutes. 5. Total sleep time was 6 hrs and 7 min. 6. 21.8% of total sleep time was spent in REM sleep. 7. Shortened sleep onset latency at 6 min. 8. Shortened REM sleep onset latency at 66 min. 9. Total awakenings were 5.  DIAGNOSIS: Normal study with no significant sleep disordered breathing.  RECOMMENDATIONS: 1. Normal study with no significant sleep disordered breathing. 2. Healthy sleep recommendations include: adequate nightly sleep (normal 7-9 hrs/night), avoidance of caffeine after noon and alcohol  near bedtime, and maintaining a sleep environment that is cool, dark and quiet. 3. Weight loss for overweight patients is recommended. 4. Snoring recommendations include: weight loss where appropriate, side sleeping, and avoidance of alcohol  before bed. 5. Operation of motor vehicle or dangerous equipment must be avoided when feeling drowsy, excessively sleepy, or mentally  fatigued. 6. An ENT consultation which may be useful for specific causes of and possible treatment of bothersome snoring . 7. Weight loss may be of benefit in reducing the severity of snoring. \  Signature: Wilbert Bihari, MD; [facility]; Diplomat, American Board of Sleep Medicine Electronically Signed: 05/23/2024 9:48:07 PM

## 2024-05-26 ENCOUNTER — Telehealth: Payer: Self-pay | Admitting: *Deleted

## 2024-05-26 NOTE — Telephone Encounter (Signed)
 The patient has been notified of the result via his mychart.

## 2024-05-26 NOTE — Telephone Encounter (Signed)
-----   Message from Wilbert Bihari sent at 05/23/2024  9:49 PM EST ----- Please let patient know that sleep study showed no significant sleep apnea.

## 2024-05-27 ENCOUNTER — Telehealth: Payer: Self-pay

## 2024-05-27 NOTE — Telephone Encounter (Signed)
 Attempted to reach patient concerning colonoscopy recall; unable to speak with patient;  left message and number to the office for patient to call back and schedule appts;    Is patient still taking ELIQUIS?  If patient is taking ELIQUIS, he will need and OV prior to procedure; If he is NOT on Eliquis, then proceed with scheduling PV and procedure;

## 2024-05-30 NOTE — Telephone Encounter (Addendum)
 Attempted to reach patient concerning colonoscopy recall; unable to speak with patient;  left message and number to the office for patient to call back and schedule appts;      Is patient still taking ELIQUIS?   If patient is taking ELIQUIS, he will need and OV prior to procedure; If he is NOT on Eliquis, then proceed with scheduling PV and procedure;

## 2024-06-11 NOTE — Telephone Encounter (Signed)
 Attempted to reach patient concerning colonoscopy recall; unable to speak with patient;  left message and number to the office for patient to call back and schedule appts;      Is patient still taking ELIQUIS?   If patient is taking ELIQUIS, he will need and OV prior to procedure; If he is NOT on Eliquis, then proceed with scheduling PV and procedure;
